# Patient Record
Sex: Male | Born: 1950 | Race: White | Hispanic: No | Marital: Married | Smoking: Never smoker
Health system: Southern US, Academic
[De-identification: ages and names within clinical notes are randomized; demographics above are authoritative.]

## PROBLEM LIST (undated history)

## (undated) DIAGNOSIS — M1990 Unspecified osteoarthritis, unspecified site: Secondary | ICD-10-CM

## (undated) DIAGNOSIS — M539 Dorsopathy, unspecified: Secondary | ICD-10-CM

## (undated) DIAGNOSIS — F32A Depression, unspecified: Secondary | ICD-10-CM

## (undated) DIAGNOSIS — R6889 Other general symptoms and signs: Secondary | ICD-10-CM

## (undated) DIAGNOSIS — G4730 Sleep apnea, unspecified: Secondary | ICD-10-CM

## (undated) DIAGNOSIS — Z9989 Dependence on other enabling machines and devices: Secondary | ICD-10-CM

## (undated) DIAGNOSIS — N429 Disorder of prostate, unspecified: Secondary | ICD-10-CM

## (undated) HISTORY — PX: HX BACK SURGERY: SHX140

## (undated) HISTORY — PX: HX HERNIA REPAIR: SHX51

---

## 2004-01-18 ENCOUNTER — Other Ambulatory Visit (HOSPITAL_COMMUNITY): Payer: Self-pay

## 2018-08-04 DIAGNOSIS — F4542 Pain disorder with related psychological factors: Secondary | ICD-10-CM | POA: Insufficient documentation

## 2018-08-04 DIAGNOSIS — F259 Schizoaffective disorder, unspecified: Secondary | ICD-10-CM | POA: Insufficient documentation

## 2018-08-04 DIAGNOSIS — F331 Major depressive disorder, recurrent, moderate: Secondary | ICD-10-CM | POA: Insufficient documentation

## 2021-01-22 IMAGING — MR MRI LUMBAR SPINE WITHOUT AND WITH CONTRAST
1 series · 2 of 2 positions shown · IV contrast (gadavist)
Comparison: Radiographs of the lumbar spine obtained earlier the same day.

﻿EXAM:  30106   MRI LUMBAR SPINE WITHOUT AND WITH CONTRAST
INDICATION: Lower back pain with right foot numbness.  History of remote back surgery.
TECHNIQUE: Multiplanar multisequential MRI of the lumbar spine was performed without and with 6 ml of Gadavist.

[Series 16: T2 · axial · 4.0mm · 0.47mm/px · z∈[-154,-149]mm · 2 of 2 slices shown]
[im 1/2]
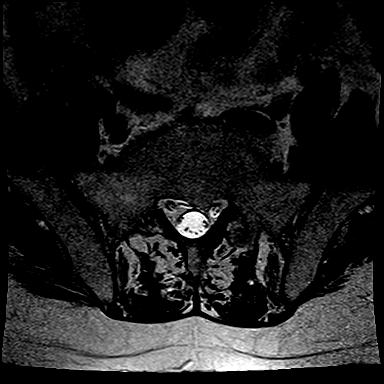
[im 2/2]
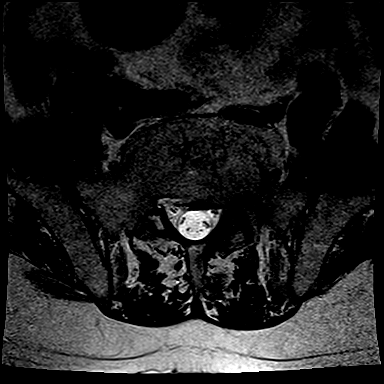

[2 of 2 positions shown; findings below may reference images not displayed]

FINDINGS: Vertebral bodies are normal in height, alignment and signal intensity.  There is no acute fracture or subluxation.  Distal spinal cord is normal in signal intensity and terminates normally at L1-2 disc space level.  Spinal canal is congenitally narrow.  

At L1-2 level, there is a small broad-based central disc bulge, mildly effacing the ventral thecal sac.  There is moderate right and mild left neural foraminal stenosis from bulging annulus.  

L2-3 level is unremarkable.  

At L3-4 level, there is a small broad-based central disc bulge resulting in mild to moderate spinal stenosis.  There is moderate bilateral neural foraminal stenosis from facet arthropathy and bulging annulus.  

At L4-5 level, there is a small broad-based central disc bulge resulting in severe spinal stenosis.  There is moderate to severe bilateral neural foraminal stenosis from facet arthropathy and bulging annulus.  

There appears to be surgical fusion at L5-S1 level.  There is no significant disc herniation.  There is moderate left neural foraminal stenosis from facet arthropathy.  

Following intravenous contrast administration, there is no abnormal nerve root or paraspinal soft tissue enhancement.
IMPRESSION: 1. Suggestion of surgical fusion of L5 and S1 vertebral bodies.  

2. Severe spinal stenosis at L4-5 level from a small central disc bulge.  

3. Mild to moderate spinal stenosis at L3-4 level from a small central disc bulge.  

4. Multi-level neural foraminal stenosis as detailed above.

## 2022-01-15 ENCOUNTER — Other Ambulatory Visit: Payer: Medicare Other | Attending: Family | Admitting: Family

## 2022-01-15 ENCOUNTER — Ambulatory Visit (INDEPENDENT_AMBULATORY_CARE_PROVIDER_SITE_OTHER): Payer: Medicare Other

## 2022-01-15 ENCOUNTER — Encounter (INDEPENDENT_AMBULATORY_CARE_PROVIDER_SITE_OTHER): Payer: Self-pay | Admitting: Family

## 2022-01-15 ENCOUNTER — Other Ambulatory Visit: Payer: Self-pay

## 2022-01-15 ENCOUNTER — Ambulatory Visit (INDEPENDENT_AMBULATORY_CARE_PROVIDER_SITE_OTHER): Payer: Medicare Other | Admitting: Family

## 2022-01-15 VITALS — BP 127/68 | HR 84 | Resp 16 | Ht 70.0 in | Wt 201.8 lb

## 2022-01-15 DIAGNOSIS — R972 Elevated prostate specific antigen [PSA]: Secondary | ICD-10-CM

## 2022-01-15 DIAGNOSIS — I10 Essential (primary) hypertension: Secondary | ICD-10-CM

## 2022-01-15 DIAGNOSIS — F319 Bipolar disorder, unspecified: Secondary | ICD-10-CM

## 2022-01-15 DIAGNOSIS — K259 Gastric ulcer, unspecified as acute or chronic, without hemorrhage or perforation: Secondary | ICD-10-CM

## 2022-01-15 DIAGNOSIS — E785 Hyperlipidemia, unspecified: Secondary | ICD-10-CM

## 2022-01-15 DIAGNOSIS — N400 Enlarged prostate without lower urinary tract symptoms: Secondary | ICD-10-CM

## 2022-01-15 DIAGNOSIS — F209 Schizophrenia, unspecified: Secondary | ICD-10-CM

## 2022-01-15 DIAGNOSIS — R350 Frequency of micturition: Secondary | ICD-10-CM

## 2022-01-15 HISTORY — DX: Hyperlipidemia, unspecified: E78.5

## 2022-01-15 HISTORY — DX: Essential (primary) hypertension: I10

## 2022-01-15 HISTORY — DX: Elevated prostate specific antigen (PSA): R97.20

## 2022-01-15 HISTORY — DX: Frequency of micturition: R35.0

## 2022-01-15 HISTORY — DX: Schizophrenia, unspecified (CMS HCC): F20.9

## 2022-01-15 HISTORY — DX: Bipolar disorder, unspecified (CMS HCC): F31.9

## 2022-01-15 HISTORY — DX: Benign prostatic hyperplasia without lower urinary tract symptoms: N40.0

## 2022-01-15 HISTORY — DX: Gastric ulcer, unspecified as acute or chronic, without hemorrhage or perforation: K25.9

## 2022-01-15 LAB — PSA, DIAGNOSTIC: PSA: 5.67 ng/mL — ABNORMAL HIGH (ref <=4.00)

## 2022-01-15 NOTE — Progress Notes (Addendum)
UROLOGY, [facility]  [address] [place] [address]      Return Patient Note     Name: Michael Norton MRN:  [id number]   Date: 01/15/2022 Age/DOB:71 y.o. 1951/10/03        Name: Michael Norton                       Date of Birth: 1951-03-11   MRN:  [id number]                         Date of visit: 01/15/2022     PCP: [facility]   Referring Provider: No referring provider defined for this encounter.     HPI:  Michael Norton is a 71 y.o. male who presents with Elevated PSA (6 MONTH FOLLOW UP)   to clinic.      PSA  01/02/21: 5.57   PSA 05/25/21: 5.34   PSA 12/26/21: 5.67   PSA 03/18/22: 6.55     There is a family history of prostate cancer in brothers.    mpMRI in 2020 and 2021 were both PI-RADS 2. On most recent, the prostate measured 4.5 x 3.4 x 3.4 cm with estimated volume of 28 cc.     Past Medical History  Current Outpatient Medications   Medication Sig   . celecoxib (CELEBREX) 200 mg Oral Capsule Take 1 Capsule (200 mg total) by mouth Twice daily   . gabapentin (NEURONTIN) 400 mg Oral Capsule Take 1 Capsule (400 mg total) by mouth   . levomilnacipran (FETZIMA) 20 mg Oral Capsule,Sustained Action 24 hr Take 1 Capsule (20 mg total) by mouth Once a day   . metoprolol succinate (TOPROL-XL) 25 mg Oral Tablet Sustained Release 24 hr Take 1 Tablet (25 mg total) by mouth Once a day   . multivit-minerals/folic acid (ADULT ONE DAILY MULTIVITAMIN ORAL) Take by mouth   . omeprazole (PRILOSEC) 40 mg Oral Capsule, Delayed Release(E.C.) Take 1 Capsule (40 mg total) by mouth Once a day   . pantoprazole (PROTONIX) 40 mg Oral Tablet, Delayed Release (E.C.) Take 1 Tablet (40 mg total) by mouth Once a day   . rosuvastatin (CRESTOR) 5 mg Oral Tablet Take 1 Tablet (5 mg total) by mouth Every evening   . tamsulosin (FLOMAX) 0.4 mg Oral Capsule Take 1 Capsule (0.4 mg total) by mouth Every evening after dinner   . ziprasidone (GEODON) 20 mg Oral Capsule Take 1 Capsule (20 mg total) by mouth Once a day   .  ziprasidone (GEODON) 60 mg Oral Capsule Take 1 Capsule (60 mg total) by mouth Twice daily with food   . zolpidem (AMBIEN) 10 mg Oral Tablet Take 1 Tablet (10 mg total) by mouth Every night as needed for Insomnia     Allergies   Allergen Reactions   . Codeine Nausea/ Vomiting     Past Medical History:   Diagnosis Date   . Bipolar disorder (CMS HCC) 01/15/2022   . BPH (benign prostatic hyperplasia) 01/15/2022   . Elevated PSA 01/15/2022    06/2020 mpMRI PI-RADS 2   . Gastric ulcer 01/15/2022   . HLD (hyperlipidemia) 01/15/2022   . HTN (hypertension) 01/15/2022   . Schizophrenia (CMS HCC) 01/15/2022   . Urinary frequency 01/15/2022         Past Surgical History:   Procedure Laterality Date   . HX BACK SURGERY     .  HX HERNIA REPAIR           Family Medical History:     Problem Relation (Age of Onset)    COPD Sister, Brother    Dementia Mother    Heart Disease Father, Brother    Liver Disease Brother    Prostate Cancer Other          Social History     Socioeconomic History   . Marital status: Married   Tobacco Use   . Smoking status: Never   . Smokeless tobacco: Never   Substance and Sexual Activity   . Alcohol use: Never   . Drug use: Never     Social Determinants of Health     Financial Resource Strain: Low Risk    . SDOH Financial: No   Transportation Needs: Low Risk    . SDOH Transportation: No   Social Connections: Unknown   . SDOH Social Isolation: Patient chooses not to answer   Intimate Partner Violence: Low Risk    . SDOH Domestic Violence: No   Housing Stability: Low Risk    . SDOH Housing Situation: I have housing.   Marland Kitchen SDOH Housing Worry: No        Patient Active Problem List    Diagnosis Date Noted   . Urinary frequency 01/15/2022   . HLD (hyperlipidemia) 01/15/2022   . Schizophrenia (CMS HCC) 01/15/2022   . Bipolar disorder (CMS HCC) 01/15/2022   . HTN (hypertension) 01/15/2022   . BPH (benign prostatic hyperplasia) 01/15/2022   . Elevated PSA 01/15/2022     06/2020 mpMRI PI-RADS 2     . Gastric ulcer 01/15/2022         REVIEW OF SYSTEMS:   As per HPI.      Physical Exam  Constitutional:       Appearance: Normal appearance.   Pulmonary:      Effort: Pulmonary effort is normal. No respiratory distress.   Neurological:      Mental Status: He is alert.   Psychiatric:         Mood and Affect: Mood normal.        BP 127/68   Pulse 84   Resp 16   Ht 1.778 m (5\' 10" )   Wt 91.5 kg (201 lb 12.8 oz)   BMI 28.96 kg/m         Assessment/Plan  Assessment/Plan   1. Elevated PSA      Check prostate specific antigen today. Assuming prostate specific antigen is stable, he will follow-up in 6 months.    Addendum 03/28/22: 03/18/22 PSA was 6.55. Reviewed with Dr. 03/20/22 today and he agrees with Bx. He will need Levaquin 500 mg po daily x 3 days (day before, day of, day after).    Rome Schlauch, FNP-C

## 2022-01-16 ENCOUNTER — Other Ambulatory Visit (INDEPENDENT_AMBULATORY_CARE_PROVIDER_SITE_OTHER): Payer: Self-pay | Admitting: Family

## 2022-01-16 DIAGNOSIS — R972 Elevated prostate specific antigen [PSA]: Secondary | ICD-10-CM

## 2022-01-31 ENCOUNTER — Other Ambulatory Visit: Payer: Self-pay

## 2022-01-31 ENCOUNTER — Other Ambulatory Visit: Payer: Medicare Other

## 2022-01-31 DIAGNOSIS — Z79899 Other long term (current) drug therapy: Secondary | ICD-10-CM

## 2022-01-31 DIAGNOSIS — Z125 Encounter for screening for malignant neoplasm of prostate: Secondary | ICD-10-CM | POA: Insufficient documentation

## 2022-01-31 DIAGNOSIS — R972 Elevated prostate specific antigen [PSA]: Secondary | ICD-10-CM

## 2022-01-31 DIAGNOSIS — G894 Chronic pain syndrome: Secondary | ICD-10-CM | POA: Insufficient documentation

## 2022-01-31 LAB — CBC WITH DIFF
BASOPHIL #: 0 10*3/uL (ref 0.00–2.50)
BASOPHIL %: 1 % (ref 0–3)
EOSINOPHIL #: 0.1 10*3/uL (ref 0.00–2.40)
EOSINOPHIL %: 1 % (ref 0–7)
HCT: 37.7 % — ABNORMAL LOW (ref 42.0–51.0)
HGB: 13.9 g/dL (ref 13.5–18.0)
LYMPHOCYTE #: 2 10*3/uL — ABNORMAL LOW (ref 2.10–11.00)
LYMPHOCYTE %: 30 % (ref 25–45)
MCH: 31.9 pg (ref 27.0–32.0)
MCHC: 36.8 g/dL — ABNORMAL HIGH (ref 32.0–36.0)
MCV: 86.6 fL (ref 78.0–99.0)
MONOCYTE #: 0.5 10*3/uL (ref 0.00–4.10)
MONOCYTE %: 7 % (ref 0–12)
MPV: 5.7 fL — ABNORMAL LOW (ref 7.4–10.4)
NEUTROPHIL #: 4.1 10*3/uL (ref 4.10–29.00)
NEUTROPHIL %: 61 % (ref 40–76)
PLATELETS: 257 10*3/uL (ref 140–440)
RBC: 4.35 10*6/uL (ref 4.20–6.00)
RDW: 12.5 % (ref 11.6–14.8)
WBC: 6.7 10*3/uL (ref 4.0–10.5)
WBCS UNCORRECTED: 6.7 10*3/uL

## 2022-01-31 LAB — COMPREHENSIVE METABOLIC PANEL, NON-FASTING
ALBUMIN/GLOBULIN RATIO: 1.6 — ABNORMAL HIGH (ref 0.8–1.4)
ALBUMIN: 4.1 g/dL (ref 3.5–5.7)
ALKALINE PHOSPHATASE: 57 U/L (ref 34–104)
ALT (SGPT): 29 U/L (ref 7–52)
ANION GAP: 6 mmol/L — ABNORMAL LOW (ref 10–20)
AST (SGOT): 27 U/L (ref 13–39)
BILIRUBIN TOTAL: 0.6 mg/dL (ref 0.3–1.2)
BUN/CREA RATIO: 16 (ref 6–22)
BUN: 16 mg/dL (ref 7–25)
CALCIUM, CORRECTED: 8.9 mg/dL (ref 8.9–10.8)
CALCIUM: 9 mg/dL (ref 8.6–10.3)
CHLORIDE: 104 mmol/L (ref 98–107)
CO2 TOTAL: 29 mmol/L (ref 21–31)
CREATININE: 1.01 mg/dL (ref 0.60–1.30)
ESTIMATED GFR: 80 mL/min/{1.73_m2} (ref >59)
GLOBULIN: 2.5 — ABNORMAL LOW (ref 2.9–5.4)
GLUCOSE: 118 mg/dL — ABNORMAL HIGH (ref 74–109)
OSMOLALITY, CALCULATED: 280 mOsm/kg (ref 270–290)
POTASSIUM: 4.4 mmol/L (ref 3.5–5.1)
PROTEIN TOTAL: 6.6 g/dL (ref 6.4–8.9)
SODIUM: 139 mmol/L (ref 136–145)

## 2022-03-18 ENCOUNTER — Other Ambulatory Visit: Payer: Medicare Other | Attending: Family | Admitting: Family

## 2022-03-18 ENCOUNTER — Other Ambulatory Visit: Payer: Self-pay

## 2022-03-18 ENCOUNTER — Ambulatory Visit (INDEPENDENT_AMBULATORY_CARE_PROVIDER_SITE_OTHER): Payer: Medicare Other

## 2022-03-18 DIAGNOSIS — R972 Elevated prostate specific antigen [PSA]: Secondary | ICD-10-CM

## 2022-03-18 LAB — PSA, DIAGNOSTIC: PSA: 6.55 ng/mL — ABNORMAL HIGH (ref <=4.00)

## 2022-03-19 ENCOUNTER — Encounter (INDEPENDENT_AMBULATORY_CARE_PROVIDER_SITE_OTHER): Payer: Medicare Other | Admitting: Family

## 2022-03-28 ENCOUNTER — Other Ambulatory Visit (INDEPENDENT_AMBULATORY_CARE_PROVIDER_SITE_OTHER): Payer: Self-pay | Admitting: Student in an Organized Health Care Education/Training Program

## 2022-03-28 DIAGNOSIS — Z01818 Encounter for other preprocedural examination: Secondary | ICD-10-CM

## 2022-04-05 ENCOUNTER — Other Ambulatory Visit (INDEPENDENT_AMBULATORY_CARE_PROVIDER_SITE_OTHER): Payer: Self-pay | Admitting: Student in an Organized Health Care Education/Training Program

## 2022-04-17 ENCOUNTER — Other Ambulatory Visit: Payer: Medicare Other | Attending: Student in an Organized Health Care Education/Training Program

## 2022-04-17 ENCOUNTER — Other Ambulatory Visit (HOSPITAL_COMMUNITY): Payer: Self-pay | Admitting: Student in an Organized Health Care Education/Training Program

## 2022-04-17 ENCOUNTER — Other Ambulatory Visit: Payer: Self-pay

## 2022-04-17 ENCOUNTER — Other Ambulatory Visit (INDEPENDENT_AMBULATORY_CARE_PROVIDER_SITE_OTHER): Payer: Self-pay | Admitting: Student in an Organized Health Care Education/Training Program

## 2022-04-17 ENCOUNTER — Inpatient Hospital Stay (HOSPITAL_COMMUNITY)
Admission: RE | Admit: 2022-04-17 | Discharge: 2022-04-17 | Disposition: A | Discharge status: Home / Self Care | Payer: Medicare Other | Source: Ambulatory Visit | Attending: Student in an Organized Health Care Education/Training Program | Admitting: Student in an Organized Health Care Education/Training Program

## 2022-04-17 DIAGNOSIS — Z01811 Encounter for preprocedural respiratory examination: Secondary | ICD-10-CM | POA: Insufficient documentation

## 2022-04-17 DIAGNOSIS — Z01818 Encounter for other preprocedural examination: Secondary | ICD-10-CM | POA: Insufficient documentation

## 2022-04-17 LAB — URINALYSIS, MACROSCOPIC
BILIRUBIN: NEGATIVE mg/dL
BLOOD: NEGATIVE mg/dL
GLUCOSE: NEGATIVE mg/dL
KETONES: NEGATIVE mg/dL
LEUKOCYTES: NEGATIVE WBCs/uL
NITRITE: NEGATIVE
PH: 7 (ref 5.0–9.0)
PROTEIN: NEGATIVE mg/dL
SPECIFIC GRAVITY: 1.013 (ref 1.002–1.030)
UROBILINOGEN: NORMAL mg/dL

## 2022-04-17 LAB — CBC
HCT: 42 % (ref 42.0–51.0)
HGB: 14.9 g/dL (ref 13.5–18.0)
MCH: 31 pg (ref 27.0–32.0)
MCHC: 35.5 g/dL (ref 32.0–36.0)
MCV: 87.4 fL (ref 78.0–99.0)
MPV: 6 fL — ABNORMAL LOW (ref 7.4–10.4)
PLATELETS: 244 10*3/uL (ref 140–440)
RBC: 4.81 10*6/uL (ref 4.20–6.00)
RDW: 13.1 % (ref 11.6–14.8)
WBC: 6.4 10*3/uL (ref 4.0–10.5)
WBCS UNCORRECTED: 6.4 10*3/uL

## 2022-04-17 LAB — BASIC METABOLIC PANEL
ANION GAP: 5 mmol/L — ABNORMAL LOW (ref 10–20)
BUN/CREA RATIO: 14 (ref 6–22)
BUN: 12 mg/dL (ref 7–25)
CALCIUM: 9.7 mg/dL (ref 8.6–10.3)
CHLORIDE: 106 mmol/L (ref 98–107)
CO2 TOTAL: 29 mmol/L (ref 21–31)
CREATININE: 0.86 mg/dL (ref 0.60–1.30)
ESTIMATED GFR: 92 mL/min/{1.73_m2} (ref >59)
GLUCOSE: 111 mg/dL — ABNORMAL HIGH (ref 74–109)
OSMOLALITY, CALCULATED: 280 mOsm/kg (ref 270–290)
POTASSIUM: 4.6 mmol/L (ref 3.5–5.1)
SODIUM: 140 mmol/L (ref 136–145)

## 2022-04-17 LAB — URINALYSIS, MICROSCOPIC
RBCS: <1 /hpf (ref <4)
SQUAMOUS EPITHELIAL: <1 /hpf (ref <28)
WBCS: <1 /hpf (ref <6)

## 2022-04-18 LAB — ECG 12 LEAD
Atrial Rate: 84 {beats}/min
Calculated P Axis: 79 degrees
Calculated R Axis: -12 degrees
Calculated T Axis: 71 degrees
PR Interval: 168 ms
QRS Duration: 92 ms
QT Interval: 366 ms
QTC Calculation: 432 ms
Ventricular rate: 84 {beats}/min

## 2022-04-23 ENCOUNTER — Other Ambulatory Visit (INDEPENDENT_AMBULATORY_CARE_PROVIDER_SITE_OTHER): Payer: Self-pay | Admitting: Student in an Organized Health Care Education/Training Program

## 2022-04-23 MED ORDER — LEVOFLOXACIN 500 MG TABLET
500.0000 mg | ORAL_TABLET | Freq: Every day | ORAL | 0 refills | Status: DC
Start: 2022-04-23 — End: 2022-05-09

## 2022-05-01 ENCOUNTER — Inpatient Hospital Stay
Admission: RE | Admit: 2022-05-01 | Discharge: 2022-05-01 | Disposition: A | Discharge status: Home / Self Care | Payer: Medicare Other | Source: Ambulatory Visit | Attending: Student in an Organized Health Care Education/Training Program | Admitting: Student in an Organized Health Care Education/Training Program

## 2022-05-01 ENCOUNTER — Other Ambulatory Visit: Payer: Self-pay

## 2022-05-01 ENCOUNTER — Ambulatory Visit (HOSPITAL_COMMUNITY): Payer: Medicare Other | Admitting: Anesthesiology

## 2022-05-01 ENCOUNTER — Encounter (HOSPITAL_COMMUNITY)
Admission: RE | Disposition: A | Discharge status: Home / Self Care | Payer: Self-pay | Source: Ambulatory Visit | Attending: Student in an Organized Health Care Education/Training Program

## 2022-05-01 ENCOUNTER — Encounter (HOSPITAL_COMMUNITY): Payer: Self-pay | Admitting: Student in an Organized Health Care Education/Training Program

## 2022-05-01 DIAGNOSIS — Z8042 Family history of malignant neoplasm of prostate: Secondary | ICD-10-CM | POA: Insufficient documentation

## 2022-05-01 DIAGNOSIS — D075 Carcinoma in situ of prostate: Secondary | ICD-10-CM | POA: Insufficient documentation

## 2022-05-01 DIAGNOSIS — N400 Enlarged prostate without lower urinary tract symptoms: Secondary | ICD-10-CM | POA: Insufficient documentation

## 2022-05-01 HISTORY — PX: PROSTATE BIOPSY: SHX241

## 2022-05-01 SURGERY — BIOPSY PROSTATE
Anesthesia: Monitor Anesthesia Care | Site: Prostate | Wound class: Clean Contaminated Wounds-The respiratory, GI, Genital, or urinary

## 2022-05-01 MED ORDER — HYDROCODONE 5 MG-ACETAMINOPHEN 325 MG TABLET
1.0000 | ORAL_TABLET | ORAL | Status: DC | PRN
Start: 2022-05-01 — End: 2022-05-01

## 2022-05-01 MED ORDER — FENTANYL (PF) 50 MCG/ML INJECTION WRAPPER
50.0000 ug | INJECTION | INTRAMUSCULAR | Status: DC | PRN
Start: 2022-05-01 — End: 2022-05-01

## 2022-05-01 MED ORDER — FAMOTIDINE (PF) 20 MG/2 ML INTRAVENOUS SOLUTION
INTRAVENOUS | Status: AC
Start: 2022-05-01 — End: 2022-05-01
  Filled 2022-05-01: qty 2

## 2022-05-01 MED ORDER — FENTANYL (PF) 50 MCG/ML INJECTION WRAPPER
12.5000 ug | INJECTION | INTRAMUSCULAR | Status: DC | PRN
Start: 2022-05-01 — End: 2022-05-01

## 2022-05-01 MED ORDER — LIDOCAINE 2 % MUCOSAL JELLY IN APPLICATOR
Status: AC
Start: 2022-05-01 — End: 2022-05-01
  Filled 2022-05-01: qty 10

## 2022-05-01 MED ORDER — LACTATED RINGERS INTRAVENOUS SOLUTION
INTRAVENOUS | Status: DC
Start: 2022-05-01 — End: 2022-05-01

## 2022-05-01 MED ORDER — HYDROMORPHONE 2 MG/ML INJECTION WRAPPER
0.2500 mg | INJECTION | INTRAMUSCULAR | Status: DC | PRN
Start: 2022-05-01 — End: 2022-05-01

## 2022-05-01 MED ORDER — MIDAZOLAM 5 MG/ML INJECTION WRAPPER
INTRAMUSCULAR | Status: AC
Start: 2022-05-01 — End: 2022-05-01
  Filled 2022-05-01: qty 1

## 2022-05-01 MED ORDER — GENTAMICIN IV - PHARMACIST TO DOSE PER PROTOCOL
Freq: Every day | Status: DC | PRN
Start: 2022-05-01 — End: 2022-05-01

## 2022-05-01 MED ORDER — ONDANSETRON HCL (PF) 4 MG/2 ML INJECTION SOLUTION
4.0000 mg | Freq: Once | INTRAMUSCULAR | Status: AC
Start: 2022-05-01 — End: 2022-05-01
  Administered 2022-05-01: 4 mg via INTRAVENOUS

## 2022-05-01 MED ORDER — SODIUM CHLORIDE 0.9 % (FLUSH) INJECTION SYRINGE
3.0000 mL | INJECTION | INTRAMUSCULAR | Status: DC | PRN
Start: 2022-05-01 — End: 2022-05-01

## 2022-05-01 MED ORDER — LIDOCAINE HCL 10 MG/ML (1 %) INJECTION SOLUTION
Freq: Once | INTRAMUSCULAR | Status: DC | PRN
Start: 2022-05-01 — End: 2022-05-01
  Administered 2022-05-01: 2 mL via INTRAMUSCULAR

## 2022-05-01 MED ORDER — ALBUTEROL SULFATE 2.5 MG/3 ML (0.083 %) SOLUTION FOR NEBULIZATION
2.5000 mg | INHALATION_SOLUTION | Freq: Once | RESPIRATORY_TRACT | Status: DC | PRN
Start: 2022-05-01 — End: 2022-05-01

## 2022-05-01 MED ORDER — HYDROMORPHONE 2 MG/ML INJECTION WRAPPER
0.5000 mg | INJECTION | INTRAMUSCULAR | Status: DC | PRN
Start: 2022-05-01 — End: 2022-05-01

## 2022-05-01 MED ORDER — PROPOFOL 10 MG/ML IV BOLUS
INJECTION | Freq: Once | INTRAVENOUS | Status: DC | PRN
Start: 2022-05-01 — End: 2022-05-01
  Administered 2022-05-01: 30 mg via INTRAVENOUS
  Administered 2022-05-01: 20 mg via INTRAVENOUS

## 2022-05-01 MED ORDER — MORPHINE 4 MG/ML INJECTION WRAPPER
1.0000 mg | INJECTION | INTRAMUSCULAR | Status: DC | PRN
Start: 2022-05-01 — End: 2022-05-01

## 2022-05-01 MED ORDER — ONDANSETRON HCL (PF) 4 MG/2 ML INJECTION SOLUTION
INTRAMUSCULAR | Status: AC
Start: 2022-05-01 — End: 2022-05-01
  Filled 2022-05-01: qty 2

## 2022-05-01 MED ORDER — FENTANYL (PF) 50 MCG/ML INJECTION SOLUTION
INTRAMUSCULAR | Status: AC
Start: 2022-05-01 — End: 2022-05-01
  Filled 2022-05-01: qty 2

## 2022-05-01 MED ORDER — SODIUM CHLORIDE 0.9 % (FLUSH) INJECTION SYRINGE
3.0000 mL | INJECTION | Freq: Three times a day (TID) | INTRAMUSCULAR | Status: DC
Start: 2022-05-01 — End: 2022-05-01

## 2022-05-01 MED ORDER — ONDANSETRON HCL (PF) 4 MG/2 ML INJECTION SOLUTION
4.0000 mg | Freq: Three times a day (TID) | INTRAMUSCULAR | Status: DC | PRN
Start: 2022-05-01 — End: 2022-05-01

## 2022-05-01 MED ORDER — FENTANYL (PF) 50 MCG/ML INJECTION WRAPPER
25.0000 ug | INJECTION | INTRAMUSCULAR | Status: DC | PRN
Start: 2022-05-01 — End: 2022-05-01

## 2022-05-01 MED ORDER — EPHEDRINE SULFATE 50 MG/ML INTRAVENOUS SOLUTION
Freq: Once | INTRAVENOUS | Status: DC | PRN
Start: 2022-05-01 — End: 2022-05-01
  Administered 2022-05-01: 10 mg via INTRAVENOUS

## 2022-05-01 MED ORDER — SODIUM CHLORIDE 0.9 % INTRAVENOUS SOLUTION
5.0000 mg/kg | Freq: Once | INTRAVENOUS | Status: AC
Start: 2022-05-01 — End: 2022-05-01
  Administered 2022-05-01: 400 mg via INTRAVENOUS
  Filled 2022-05-01: qty 10

## 2022-05-01 MED ORDER — IPRATROPIUM 0.5 MG-ALBUTEROL 3 MG (2.5 MG BASE)/3 ML NEBULIZATION SOLN
3.0000 mL | INHALATION_SOLUTION | Freq: Once | RESPIRATORY_TRACT | Status: DC | PRN
Start: 2022-05-01 — End: 2022-05-01

## 2022-05-01 MED ORDER — DEXAMETHASONE SODIUM PHOSPHATE 4 MG/ML INJECTION SOLUTION
4.0000 mg | Freq: Once | INTRAMUSCULAR | Status: AC
Start: 2022-05-01 — End: 2022-05-01
  Administered 2022-05-01: 4 mg via INTRAVENOUS

## 2022-05-01 MED ORDER — ONDANSETRON HCL (PF) 4 MG/2 ML INJECTION SOLUTION
4.0000 mg | Freq: Once | INTRAMUSCULAR | Status: DC | PRN
Start: 2022-05-01 — End: 2022-05-01

## 2022-05-01 MED ORDER — FAMOTIDINE (PF) 20 MG/2 ML INTRAVENOUS SOLUTION
20.0000 mg | Freq: Once | INTRAVENOUS | Status: AC
Start: 2022-05-01 — End: 2022-05-01
  Administered 2022-05-01: 20 mg via INTRAVENOUS

## 2022-05-01 MED ORDER — DEXAMETHASONE SODIUM PHOSPHATE 4 MG/ML INJECTION SOLUTION
INTRAMUSCULAR | Status: AC
Start: 2022-05-01 — End: 2022-05-01
  Filled 2022-05-01: qty 1

## 2022-05-01 MED ORDER — MIDAZOLAM 5 MG/ML INJECTION WRAPPER
2.5000 mg | Freq: Once | INTRAMUSCULAR | Status: DC | PRN
Start: 2022-05-01 — End: 2022-05-01
  Administered 2022-05-01: 2.5 mg via INTRAVENOUS

## 2022-05-01 MED ORDER — LIDOCAINE HCL 10 MG/ML (1 %) INJECTION SOLUTION
INTRAMUSCULAR | Status: AC
Start: 2022-05-01 — End: 2022-05-01
  Filled 2022-05-01: qty 20

## 2022-05-01 MED ORDER — HALOPERIDOL LACTATE 5 MG/ML INJECTION SOLUTION
1.0000 mg | Freq: Once | INTRAMUSCULAR | Status: DC | PRN
Start: 2022-05-01 — End: 2022-05-01

## 2022-05-01 MED ORDER — FENTANYL (PF) 50 MCG/ML INJECTION WRAPPER
INJECTION | Freq: Once | INTRAMUSCULAR | Status: DC | PRN
Start: 2022-05-01 — End: 2022-05-01
  Administered 2022-05-01: 50 ug via INTRAVENOUS

## 2022-05-01 SURGICAL SUPPLY — 44 items
BAG BIOHAZ RD 30X24IN THK3 MIL C8-10GL LLDPE INFCT WASTE CAN (MED SURG SUPPLIES) ×2
BAG BIOHAZ RD 30X24IN THK3 MIL C8-10GL LLDPE INFCT WASTE CAN PNCT RST (MED SURG SUPPLIES) ×1
BAG BIOHAZ RD 43X30IN THK3 MIL C20-30GL LLDPE INFCT WASTE (MED SURG SUPPLIES) ×2
BAG BIOHAZ RD 43X30IN THK3 MIL C20-30GL LLDPE INFCT WASTE CAN PNCT RST (MED SURG SUPPLIES) ×1 IMPLANT
CONTAINR HISTO C90ML 10% NEUT BF FRMLN POLYPROP PREFL 60ML (MISCELLANEOUS PT CARE ITEMS) ×24 IMPLANT
CONV USE 31829 - NEEDLE HYPO  18GA 1.5IN STD REG BVL LF (MED SURG SUPPLIES) ×1 IMPLANT
CONV USE ITEM 321852 - GLOVE SURG 6.5 LF  PLISPRN (GLOVES AND ACCESSORIES) ×1 IMPLANT
CONV USE ITEM 81225 - BAG BIOHAZ RD 30X24IN THK3 MIL C8-10GL LLDPE INFCT WASTE CAN PNCT RST (MED SURG SUPPLIES) ×1 IMPLANT
COVER PROBE 8X1IN NONST LF  ECVT (MED SURG SUPPLIES) ×1
COVER PROBE 8X1IN NONST LF ECVT (MED SURG SUPPLIES) ×2
DISCONTINUED USE ITEM 340762 - COVER PROBE 8X1IN NONST LF  ECVT (MED SURG SUPPLIES) ×1 IMPLANT
DRESSING NON-ADHERENT 3X8_STRL (WOUND CARE/ENTEROSTOMAL SUPPLY) ×4
GLOVE SURG 6.5 LF PF SMOOTH STRL WHT PLISPRN (GLOVES AND ACCESSORIES) ×2
GLOVE SURG 7 LF  PF STRL PLISPRN DISP (GLOVES AND ACCESSORIES) ×1 IMPLANT
GLOVE SURG 7 LF PF SMOOTH STRL WHT PLISPRN (GLOVES AND ACCESSORIES) ×2
GLOVE SURG 7.5 LF PF SMOOTH STRL WHT PLISPRN (GLOVES AND ACCESSORIES) ×2
GLOVE SURG LF  PF STRL 7.5 PLISPRN DISP (GLOVES AND ACCESSORIES) ×1 IMPLANT
GOWN SURG 2XL STD LGTH REG L3 NONREINFORCE BRTHBL TWL STRL (DRAPE/PACKS/SHEETS/OR TOWEL) ×2
GOWN SURG 2XL STD LGTH REG L3 NONREINFORCE BRTHBL TWL STRL LF  DISP BLU HALYARD SPECTRUM SMS (DRAPE/PACKS/SHEETS/OR TOWEL) ×1 IMPLANT
GOWN SURG XL STD LGTH L3 NONREINFORCE HKLP CLSR TWL STRL LF (DRAPE/PACKS/SHEETS/OR TOWEL) ×2
GOWN SURG XL STD LGTH L3 NONREINFORCE HKLP CLSR TWL STRL LF  DISP BLU SPECTRUM SMS (DRAPE/PACKS/SHEETS/OR TOWEL) ×1
GOWN SURG XL STD LGTH L3 NONREINFORCE HKLP CLSR TWL STRL LF DISP BLU SPECTRUM SMS (DRAPE/PACKS/SHEETS/OR TOWEL) ×1 IMPLANT
GUIDE NEEDLE 1.6MM BIOPSY BPL STRL DISP LF  TRANSDUC 8818 8808E (MED SURG SUPPLIES) ×1 IMPLANT
GUIDE NEEDLE 1.6MM BIOPSY BPL_STRL DISP LF TRANSDUC 8818 (MED SURG SUPPLIES) ×2
HDPE THK22 UM C40-45 GL L48 IN X W40 IN NATURAL (MISCELLANEOUS PT CARE ITEMS) ×2 IMPLANT
INSTR BIOPSY PNK 20CM 18GA MXCOR 22MM ANG 2 TRGR UL SHRP TIP BVL 13.8CM STRL LF  DISP (MED SURG SUPPLIES) ×1 IMPLANT
INSTR BIOPSY PNK 20CM 18GA MXC_OR ANG 22MM 2 TRGR UL SHRP TIP (MED SURG SUPPLIES) ×2
INSTR BIOPSY PNK 25CM 18GA MXCOR 22MM ANG 2 TRGR UL SHRP TIP BVL 13.8CM STRL LF  DISP (MED SURG SUPPLIES) ×1 IMPLANT
INSTR BIOPSY PNK 25CM 18GA MXC_OR 22MM ANG 2 TRGR UL SHRP TIP (MED SURG SUPPLIES) ×2
LABEL MED CORRECT MED LABELING SYS 4 FLG 2 SHEET 24 PRPRNT (MED SURG SUPPLIES) ×2
LABEL MED CORRECT MED LABELING SYS 4 FLG 2 SHEET 24 PRPRNT STRL (MED SURG SUPPLIES) ×1 IMPLANT
LINER SUCT MEDIVAC CRD TW LOCK LID SHTOF VALVE CAN PORT 3L LF  DISP (MED SURG SUPPLIES) ×1 IMPLANT
LINER SUCT MEDIVAC CRD TW LOCK_LID SHTOF VALVE CAN PORT 3L (MED SURG SUPPLIES) ×2
NEEDLE HYPO 18GA 1.5IN STD RE_G BVL LF (MED SURG SUPPLIES) ×2
NEEDLE SPINAL BLK 7IN 22GA QUINCKE LONG LGTH REG WL POLYPROP STRL LF  DISP (MED SURG SUPPLIES) ×1 IMPLANT
NEEDLE SPINAL BLK 7IN 22GA QUI_NCKE LONG LGTH REG WL POLYPROP (MED SURG SUPPLIES) ×2
PAD DRESS 8X3IN MDCHC NONADH NWVN LF  STRL DISP WHT (WOUND CARE SUPPLY) ×2 IMPLANT
SOL IRRG 0.9% NACL 1000ML PLASTIC PR BTL ISTNC N-PYRG STRL LF (MEDICATIONS/SOLUTIONS) ×1 IMPLANT
SOLUTION IRRG NS 2F7124 1000CC_12/CS (MEDICATIONS/SOLUTIONS) ×2
SYRINGE LL 10ML LF  STRL GRAD N-PYRG DEHP-FR PVC FREE MED DISP (MED SURG SUPPLIES) ×1 IMPLANT
SYRINGE LL 10ML LF STRL MED D_ISP (MED SURG SUPPLIES) ×2
TOWEL 24X16IN COTTON BLU DISP SURG STRL LF (DRAPE/PACKS/SHEETS/OR TOWEL) ×2 IMPLANT
TUBE BUBBLE CONNECTING_8888280214 1EA/BX/CS (MED SURG SUPPLIES) ×2
TUBING SUCT CLR 100FT 3/16IN ARGYLE UNIV PVC NCDTV BBL NONST LF (MED SURG SUPPLIES) ×1 IMPLANT

## 2022-05-01 NOTE — OR Nursing (Signed)
MEASUREMENTS OBTAINED BY DR CUTRONE PRIOR START OF CASE AS FOLLOWS:  4.4; 3.1;2.9

## 2022-05-01 NOTE — OR Surgeon (Signed)
[facility]                                              OPERATIVE NOTE    Patient Name: Ludwin, Flahive Number: [id number]  Date of Service: 05/01/2022   Date of Birth: 1951-01-22    All elements must be documented.    Pre-Operative Diagnosis:elevated PSA   Post-Operative Diagnosis:same  Procedure(s)/Description:transrectal ultrasound guided prostate needle biopsy  Findings: 22cc prostate, no hypoechoic lesions    Patient prepped and draped in sterile fashion.  Well lubricated transrectal ultrasound probe was inserted into the rectum in the prostate images identified.  Measurements were taken out as listed above.  No grossly hypoechoic lesions were identified.  1% lidocaine was injected at the tip of the seminal vesicles bilaterally for a volume of 2 cc on each side.  A systematic fashion 6 biopsies were taken from each side of the prostate.  Specimen was sent for analysis.  Patient tolerated procedure well with no untoward event        Attending Surgeon: Arlana Hove  Assistant(s): NA    Anesthesia Type: General  Estimated Blood Loss:  Minimal  Blood Given: NA  Fluids Given: NS  Complications (unintended/unexpected/iatrogenic/accidental/inadvertent events):  NA  Characteristic Event (routinely expected or inherent to the difficulty/nature of the procedure): NA  Did the use of current and/or prior Anticoagulants impact the outcome of the case? No  Wound Class: Clean Contaminated Wounds -Respiratory, GI, Genital, or Urinary    Tubes: None  Drains: None  Specimens/ Cultures: 12 prostate biopsies  Implants: NA           Disposition: PACU - hemodynamically stable.  Condition: stable    Plan:  Follow up in one week       Arlana Hove, DO

## 2022-05-01 NOTE — Anesthesia Transfer of Care (Signed)
ANESTHESIA TRANSFER OF CARE   Michael Norton is a 71 y.o. ,male, Weight: 91.6 kg (202 lb)   had Procedure(s):  CYSTOSCOPY; TRANSRECTAL ULTRASOUND OF PROSTATE; ULTRASOUND GUIDED PROSTATE BIOPSY  performed  05/01/22   Primary Service: Arlana Hove, DO    Past Medical History:   Diagnosis Date   . Bipolar disorder (CMS HCC) 01/15/2022   . BPH (benign prostatic hyperplasia) 01/15/2022   . Elevated PSA 01/15/2022    06/2020 mpMRI PI-RADS 2   . Gastric ulcer 01/15/2022   . HLD (hyperlipidemia) 01/15/2022   . HTN (hypertension) 01/15/2022   . Schizophrenia (CMS HCC) 01/15/2022   . Urinary frequency 01/15/2022      Allergy History as of 05/01/22     CODEINE       Noted Status Severity Type Reaction    01/15/22 0630 [address], [place] 11/07/17 Active Low  Nausea/ Vomiting              I completed my transfer of care / handoff to the receiving personnel during which we discussed:  Access, Airway, All key/critical aspects of case discussed, Analgesia, Antibiotics, Expectation of post procedure, Fluids/Product, Gave opportunity for questions and acknowledgement of understanding, Labs and PMHx      Post Location: PACU                        Additional Info:Report to RN @ Bedside                                   Last OR Temp: Temperature: 36.4 C (97.6 F)  ABG:  POTASSIUM   Date Value Ref Range Status   04/17/2022 4.6 3.5 - 5.1 mmol/L Final     KETONES   Date Value Ref Range Status   04/17/2022 Negative Negative, Trace mg/dL Final     CALCIUM   Date Value Ref Range Status   04/17/2022 9.7 8.6 - 10.3 mg/dL Final     Calculated P Axis   Date Value Ref Range Status   04/17/2022 79 degrees Final     Calculated R Axis   Date Value Ref Range Status   04/17/2022 -12 degrees Final     Calculated T Axis   Date Value Ref Range Status   04/17/2022 71 degrees Final     Airway:* No LDAs found *  Blood pressure 109/68, pulse 65, temperature 36.4 C (97.6 F), resp. rate (!) 22, height 1.778 m (5\' 10" ), weight 91.6 kg (202 lb), SpO2 100 %.

## 2022-05-01 NOTE — H&P (Signed)
[facility]  Urology Admission History and Physical      Carmel, Waddington, 71 y.o. male  Encounter Start Date:  05/01/2022  Inpatient Admission Date:    Date of Birth:  [birth year]    PCP: [facility]    Information Obtained from: patient  Chief Complaint: elevated PSA       HPI:    Michael Norton is a 71 y.o., White male who presents with elevated PSA and family history of prostate cancer in brothers. He presents today for prostate biopsy. He denies fevers, chills, nausea, vomiting, hematuria, dysuria, flank pain, incontinence, dribbling, hesitancy, suprapubic pain, headaches, vision changes, shortness of breath, chest pain.         ROS:  MUST comment on all "Abnormal" findings   ROS Other than ROS in the HPI, all other systems were negative.      PAST MEDICAL/ FAMILY/ SOCIAL HISTORY:       Past Medical History:   Diagnosis Date   . Bipolar disorder (CMS HCC) 01/15/2022   . BPH (benign prostatic hyperplasia) 01/15/2022   . Elevated PSA 01/15/2022    06/2020 mpMRI PI-RADS 2   . Gastric ulcer 01/15/2022   . HLD (hyperlipidemia) 01/15/2022   . HTN (hypertension) 01/15/2022   . Schizophrenia (CMS HCC) 01/15/2022   . Urinary frequency 01/15/2022         Allergies   Allergen Reactions   . Codeine Nausea/ Vomiting     Medications Prior to Admission     Prescriptions    ascorbic acid, vitamin C, (VITAMIN C) 500 mg Oral Tablet    Take 1 Tablet (500 mg total) by mouth Once a day    celecoxib (CELEBREX) 200 mg Oral Capsule    Take 1 Capsule (200 mg total) by mouth Twice daily    diphenhydrAMINE (SLEEP AID, DIPHENHYDRAMINE,) 25 mg Oral Capsule    Take 1 Capsule (25 mg total) by mouth Every evening    gabapentin (NEURONTIN) 400 mg Oral Capsule    Take 1 Capsule (400 mg total) by mouth 5 times a day    levoFLOXacin (LEVAQUIN) 500 mg Oral Tablet    Take 1 Tablet (500 mg total) by mouth Once a day for 3 days Take the day before your procedure, the morning of your procedure and the day after your procedure.     levomilnacipran (FETZIMA) 20 mg Oral Capsule,Sustained Action 24 hr    Take 1 Capsule (20 mg total) by mouth Once a day    lisinopriL (PRINIVIL) 20 mg Oral Tablet    Take 1 Tablet (20 mg total) by mouth Once a day    metoprolol tartrate (LOPRESSOR) 25 mg Oral Tablet    Take 1 Tablet (25 mg total) by mouth Twice daily    multivit-minerals/folic acid (ADULT ONE DAILY MULTIVITAMIN ORAL)    Take 1 Tablet by mouth Once a day    omeprazole (PRILOSEC) 40 mg Oral Capsule, Delayed Release(E.C.)    Take 1 Capsule (40 mg total) by mouth Once a day    rosuvastatin (CRESTOR) 5 mg Oral Tablet    Take 1 Tablet (5 mg total) by mouth    tamsulosin (FLOMAX) 0.4 mg Oral Capsule    Take 1 Capsule (0.4 mg total) by mouth Every evening after dinner    vitamin E 400 unit Oral Capsule    Take 1 Capsule (400 Units total) by mouth Every 7 days    ziprasidone (GEODON) 20 mg Oral Capsule  Take 1 Capsule (20 mg total) by mouth Every morning    ziprasidone (GEODON) 60 mg Oral Capsule    Take 1 Capsule (60 mg total) by mouth Every evening    zolpidem (AMBIEN) 10 mg Oral Tablet    Take 1 Tablet (10 mg total) by mouth Every night as needed for Insomnia         Gentamicin IV - Pharmacist to Dose per Protocol, , Does not apply, Daily PRN  LR premix infusion, , Intravenous, Continuous  LR premix infusion, , Intravenous, Continuous  LR premix infusion, , Intravenous, Continuous  NS flush syringe, 3 mL, Intracatheter, Q8HRS  NS flush syringe, 3 mL, Intracatheter, Q1H PRN  NS flush syringe, 3 mL, Intracatheter, Q8HRS  NS flush syringe, 3 mL, Intracatheter, Q1H PRN      Past Surgical History:   Procedure Laterality Date   . HX BACK SURGERY     . HX HERNIA REPAIR           Social History     Tobacco Use   . Smoking status: Never   . Smokeless tobacco: Never   Vaping Use   . Vaping Use: Never used   Substance Use Topics   . Alcohol use: Never   . Drug use: Never       Gen: NAD, alert  Pulm: unlabored at rest  CV: palpable pulses  Abd: soft, Nt/ND  GU: no  suprapubic tenderness, no CVAT      Assessment & Plan:   There are no active hospital problems to display for this patient.      Elevated PSA with family history of prostate cancer in brothers, prostate vaolume 28cc  . I discussed the pathophysiology and potential causes of PSA elevation, including, but not limited to malignant (prostate adenocarcinoma), benign (prostate enlargement), infectious/inflammatory (prostatitis, urinary tract infection) and iatrogenic/idiopathic (recent ejaculation, instrumentation & age-related change) etiologies. Considering patient's serum PSA within the context of his current age, overall health and desire to identify early a potentially curable malignancy, I would recommend transrectal ultrasound-guided prostate needle biopsy (TRUS-PNBx)  . Patient was counseled on the risks of TRUS-PNBx including febrile urinary tract infection with bacteremia requiring hospitalization, prolonged bleeding (hematuria, hematochezia, hematospermia), acute urinary retention, procedural discomfort/pain, anxiety related to the diagnosis as well as limitations of the currently available biopsy schema including undersampling (false negative, incorrect risk stratification), oversampling (detection of clinically-insignificant cancer), and sampling error (necessity for repeat biopsy).  . Patient was instructed on the need for the following:  . Interruption of all blood thinning agents (Aspirin, NSAIDs, anticoagulants, antiplatelets) for 7-10 days prior  . Prophylactic Levofloxacin 500 mg the day before, the day of, and the day after biopsy              Arlana Hove, DO

## 2022-05-01 NOTE — Nurses Notes (Signed)
[id number] F[address] in place. Pt stated that he has had diarrhea and he has had some bleeding since the diarrhea started. No active bleeding noted from rectum. No bleeding noted from penis.

## 2022-05-01 NOTE — Anesthesia Postprocedure Evaluation (Signed)
Anesthesia Post Op Evaluation    Patient: Michael Norton  Procedure(s):  CYSTOSCOPY; TRANSRECTAL ULTRASOUND OF PROSTATE; ULTRASOUND GUIDED PROSTATE BIOPSY    Last Vitals:Temperature: 36.4 C (97.6 F) (05/01/22 0845)  Heart Rate: 67 (05/01/22 0900)  BP (Non-Invasive): 104/73 (05/01/22 0900)  Respiratory Rate: 20 (05/01/22 0900)  SpO2: 100 % (05/01/22 0900)    No notable events documented.    Patient is sufficiently recovered from the effects of anesthesia to participate in the evaluation and has returned to their pre-procedure level.  Patient location during evaluation: PACU       Patient participation: complete - patient participated  Level of consciousness: awake and alert and responsive to verbal stimuli    Pain score: 0  Pain management: adequate  Airway patency: patent    Anesthetic complications: no  Cardiovascular status: acceptable  Respiratory status: acceptable  Hydration status: acceptable  Patient post-procedure temperature: Pt Normothermic   PONV Status: Absent

## 2022-05-01 NOTE — Discharge Instructions (Addendum)
Return to the office for your follow up appointment on July 13th, at 9:15 AM.     If any questions or concerns call the office    Follow any instructions given by Dr Fredonia Highland    Drink lots of fluids; water recommended

## 2022-05-01 NOTE — Nurses Notes (Signed)
No rectal bleeding noted.. Foreskin remains in place. No bleeding noted from penis

## 2022-05-01 NOTE — Anesthesia Preprocedure Evaluation (Addendum)
ANESTHESIA PRE-OP EVALUATION  Planned Procedure: CYSTOSCOPY; TRANSRECTAL ULTRASOUND OF PROSTATE; ULTRASOUND GUIDED PROSTATE BIOPSY (Prostate)  Review of Systems     anesthesia history negative     patient summary reviewed  nursing notes reviewed        Pulmonary  negative pulmonary ROS,    Cardiovascular    Hypertension and ECG reviewed , Exercise Tolerance: > or = 4 METS        GI/Hepatic/Renal   negative GI/hepatic/renal ROS,         Endo/Other   neg endo/other ROS,       Neuro/Psych/MS    psychiatric history, bipolar disorder     Cancer    negative hematology/oncology ROS,                   Physical Assessment      Airway       Mallampati: III    TM distance: >3 FB    Mouth Opening: poor.            Dental           (+) caps           Pulmonary           Cardiovascular             Other findings            Plan  ASA 2     Planned anesthesia type: MAC                           Anesthetic plan and risks discussed with patient             Patient's NPO status is appropriate for Anesthesia.

## 2022-05-02 LAB — PROSTATE BIOPSY SPECIMENS: Clinical History: ELEVATED

## 2022-05-09 ENCOUNTER — Encounter (INDEPENDENT_AMBULATORY_CARE_PROVIDER_SITE_OTHER): Payer: Self-pay | Admitting: Student in an Organized Health Care Education/Training Program

## 2022-05-09 ENCOUNTER — Ambulatory Visit (INDEPENDENT_AMBULATORY_CARE_PROVIDER_SITE_OTHER): Payer: Medicare Other | Admitting: Student in an Organized Health Care Education/Training Program

## 2022-05-09 ENCOUNTER — Other Ambulatory Visit: Payer: Self-pay

## 2022-05-09 VITALS — BP 110/60 | HR 113 | Wt 189.0 lb

## 2022-05-09 DIAGNOSIS — N401 Enlarged prostate with lower urinary tract symptoms: Secondary | ICD-10-CM

## 2022-05-09 DIAGNOSIS — N400 Enlarged prostate without lower urinary tract symptoms: Secondary | ICD-10-CM

## 2022-05-09 DIAGNOSIS — Z8042 Family history of malignant neoplasm of prostate: Secondary | ICD-10-CM

## 2022-05-09 DIAGNOSIS — R972 Elevated prostate specific antigen [PSA]: Secondary | ICD-10-CM

## 2022-05-09 NOTE — Progress Notes (Signed)
[facility]  UROLOGY, [facility]    Progress Note    Name: Michael Norton MRN:  [id number]   Date: 05/09/2022 Age: 71 y.o.       Chief Complaint: Post Op (Post op f/u from prostate biopsy.)    Subjective:   Michael Norton is a pleasant 71 year old man with a past medical history of BPH on Flomax and elevated PSA status post negative biopsy on May 01, 2022.  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  He did well after his biopsy and denies any complaints.  This was his 1st lifetime biopsy.  He has a family history of prostate cancer in brothers. He denies fevers, chills, nausea, vomiting, hematuria, dysuria, flank pain, incontinence, dribbling, hesitancy, suprapubic pain, headaches, vision changes, shortness of breath, chest pain.  He is had 2 multiparametric MRI is 11/17/2019 in 2021 both which demonstrated PI-RADS 2 lesions.  His prostate volume is 28 cc.    PSA  01/02/21: 5.57   PSA 05/25/21: 5.34   PSA 12/26/21: 5.67   PSA 03/18/22: 6.55     Objective :  BP 110/60 (Site: Right, Patient Position: Sitting, Cuff Size: Adult)   Pulse (!) 113   Wt 85.7 kg (189 lb)   BMI 27.12 kg/m       Gen: NAD, alert  Pulm: unlabored at rest  CV: palpable pulses  Abd: soft, Nt/ND  GU: no suprapubic tenderness, no CVAT    Data reviewed:    Current Outpatient Medications   Medication Sig   . ascorbic acid, vitamin C, (VITAMIN C) 500 mg Oral Tablet Take 1 Tablet (500 mg total) by mouth Once a day   . celecoxib (CELEBREX) 200 mg Oral Capsule Take 1 Capsule (200 mg total) by mouth Twice daily   . diphenhydrAMINE (SLEEP AID, DIPHENHYDRAMINE,) 25 mg Oral Capsule Take 1 Capsule (25 mg total) by mouth Every evening   . gabapentin (NEURONTIN) 400 mg Oral Capsule Take 1 Capsule (400 mg total) by mouth 5 times a day   . levomilnacipran (FETZIMA) 20 mg Oral Capsule,Sustained Action 24 hr Take 1 Capsule (20 mg total) by mouth Once a day   .  lisinopriL (PRINIVIL) 20 mg Oral Tablet Take 1 Tablet (20 mg total) by mouth Once a day   . metoprolol tartrate (LOPRESSOR) 25 mg Oral Tablet Take 1 Tablet (25 mg total) by mouth Twice daily   . multivit-minerals/folic acid (ADULT ONE DAILY MULTIVITAMIN ORAL) Take 1 Tablet by mouth Once a day   . omeprazole (PRILOSEC) 40 mg Oral Capsule, Delayed Release(E.C.) Take 1 Capsule (40 mg total) by mouth Once a day   . rosuvastatin (CRESTOR) 5 mg Oral Tablet Take 1 Tablet (5 mg total) by mouth   . tamsulosin (FLOMAX) 0.4 mg Oral Capsule Take 1 Capsule (0.4 mg total) by mouth Every evening after dinner   . vitamin E 400 unit Oral Capsule Take 1 Capsule (400 Units total) by mouth Every 7 days   . ziprasidone (GEODON) 20 mg Oral Capsule Take 1 Capsule (20 mg total) by mouth Every morning   . ziprasidone (GEODON) 60 mg Oral Capsule Take 1 Capsule (60 mg total) by mouth Every evening   . zolpidem (AMBIEN) 10 mg Oral Tablet Take 1 Tablet (10 mg total) by mouth Every night as needed for Insomnia     Assessment/Plan  Problem List Items Addressed This Visit    None  Elevated PSA with family history of prostate cancer in 2 brothers status post negative prostate needle biopsy on May 01, 2022; 28cc volume  . Based on patient's clinical findings including his recent prostate specific antigen level, age, ethnicity and relevant risk factors and in accordance with the American Urological Association (AUA) & National Comprehensive Cancer Network (NCCN) published screening guidelines, I would recommend the following:  . Continued annual prostate specific antigen & digital rectal exam screening with cessation of screening at age 100 or upon developing more serious health issues.  Marland Kitchen PSA in one year        BPH/LUTS on Flomax  . Discussed the differential diagnosis, pathophysiology and nature of benign prostate enlargement causing lower urinary tract symptoms  . Counseled patient on conservative management options including appropriate fluid  management, avoidance of diuretics including caffeine and alcohol  . Continue Tamsulosin Hydrochloride (FlomaxT) 0.4 mg P.O. daily:    . I have discussed in great detail with the patient the treatment of prostate enlargement/lower urinary tract symptoms using tamsulosin.  I have explained my rationale for using tamsulosin as well as potential risks of asthenia (2%), dizziness (5%), rhinitis (3%) and abnormal ejaculation (11%). I encouraged patient to report any prior or current history of alpha-1 antagonist use to their opthalmic surgeon before undergoing any eye surgery due to the risk of intraoperative floppy iris syndrome.  The patient expressed an understanding of the treatment, possible reactions, and possible prognosis.        Arlana Hove, DO     A combined total of 45 minutes were spent preparing to see the patient, reviewing previous records, ordering tests/medications/procedures, documenting the clinical encounter as well as performing a medically appropriate evaluation and independently interpreting results and communicating them to the patient/family/caregiver as specifically outlined above in the impression and plan.

## 2022-05-15 ENCOUNTER — Other Ambulatory Visit (INDEPENDENT_AMBULATORY_CARE_PROVIDER_SITE_OTHER): Payer: Self-pay | Admitting: Family

## 2022-05-22 DIAGNOSIS — G4733 Obstructive sleep apnea (adult) (pediatric): Secondary | ICD-10-CM | POA: Insufficient documentation

## 2022-07-23 ENCOUNTER — Encounter (INDEPENDENT_AMBULATORY_CARE_PROVIDER_SITE_OTHER): Payer: Self-pay | Admitting: Family

## 2022-07-24 ENCOUNTER — Encounter (INDEPENDENT_AMBULATORY_CARE_PROVIDER_SITE_OTHER): Payer: Medicare Other | Admitting: Family

## 2022-09-19 ENCOUNTER — Other Ambulatory Visit (INDEPENDENT_AMBULATORY_CARE_PROVIDER_SITE_OTHER): Payer: Self-pay | Admitting: Student in an Organized Health Care Education/Training Program

## 2022-11-08 ENCOUNTER — Other Ambulatory Visit (HOSPITAL_COMMUNITY): Payer: Self-pay

## 2022-11-08 DIAGNOSIS — M542 Cervicalgia: Secondary | ICD-10-CM

## 2022-11-19 ENCOUNTER — Inpatient Hospital Stay
Admission: RE | Admit: 2022-11-19 | Discharge: 2022-11-19 | Disposition: A | Discharge status: Home / Self Care | Payer: Medicare Other | Source: Ambulatory Visit

## 2022-11-19 ENCOUNTER — Other Ambulatory Visit: Payer: Self-pay

## 2022-11-19 DIAGNOSIS — M542 Cervicalgia: Secondary | ICD-10-CM | POA: Insufficient documentation

## 2022-11-29 IMAGING — MR MRI LUMBAR SPINE WITHOUT CONTRAST
4 of 6 series · 30 of 48 positions shown · IV contrast (gadolinium)
Comparison: MRI lumbar spine dated 01/22/2021.

﻿EXAM:  [id number]   MRI LUMBAR SPINE WITHOUT CONTRAST
INDICATION: 71-year-old with worsening low back pain.  Radicular symptoms to right foot.  Remote past history of back surgery.  No malignancy.
TECHNIQUE: Multiplanar, multisequential MRI of the lumbosacral spine was performed without gadolinium contrast.

[Series 6: T2 · sagittal · 4.0mm · 0.94mm/px · 6 of 13 slices shown (1 of 3)]
[im 1/13]
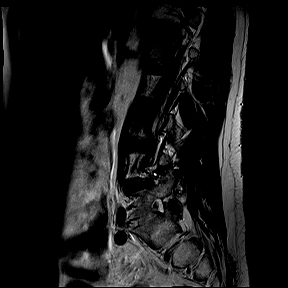
[im 3/13]
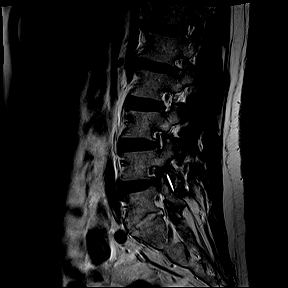
[im 5/13]
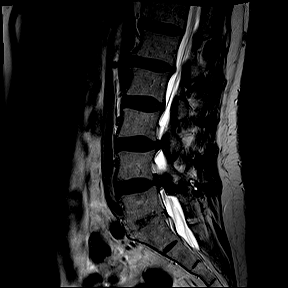
[im 8/13]
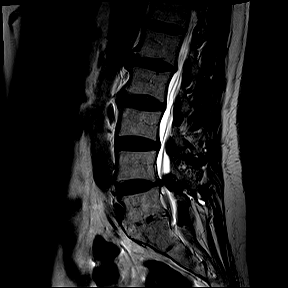
[im 10/13]
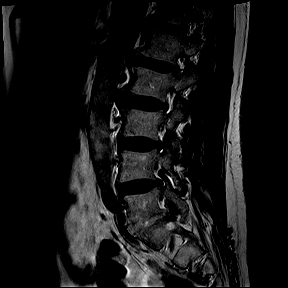
[im 13/13]
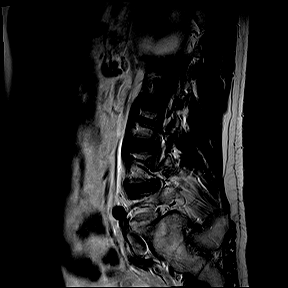

[Series 7: T1 · sagittal · 4.0mm · 0.94mm/px · 5 of 13 slices shown]
[im 1/13]
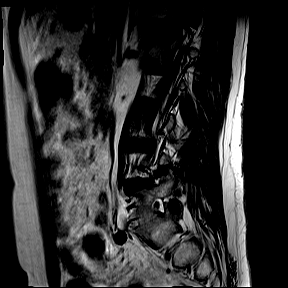
[im 3/13]
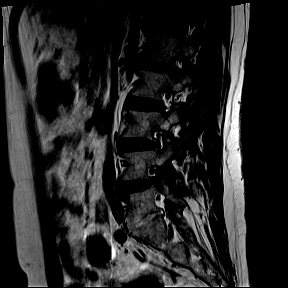
[im 5/13]
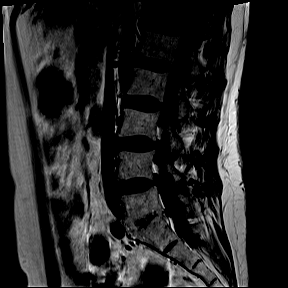
[im 8/13]
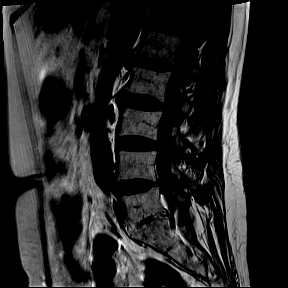
[im 13/13]
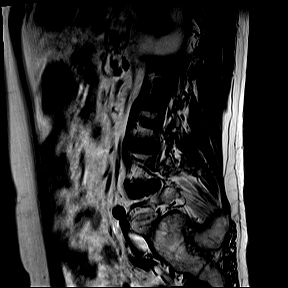

[Series 9: T2 · coronal · 5.0mm · 0.82mm/px · 8 of 18 slices shown (2 of 3)]
[im 1/18]
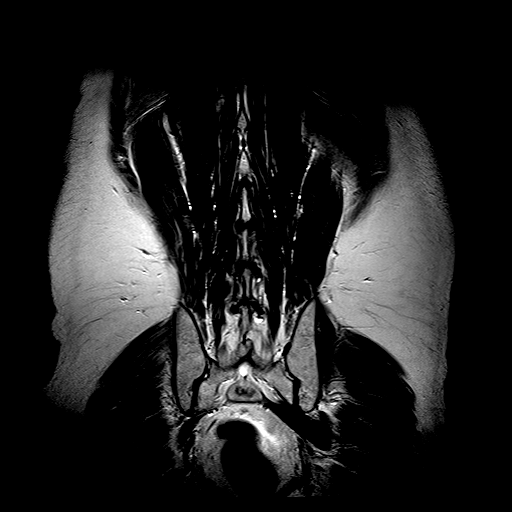
[im 3/18]
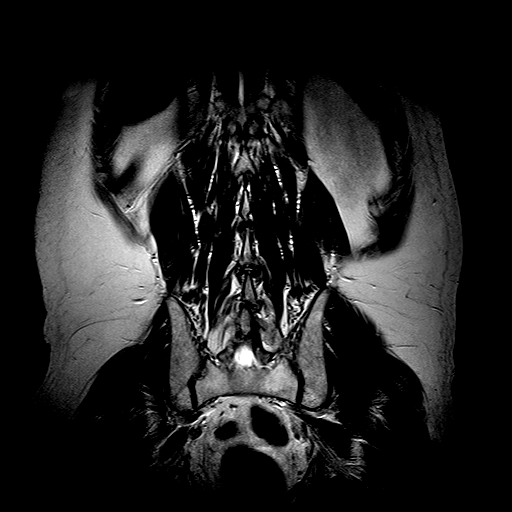
[im 5/18]
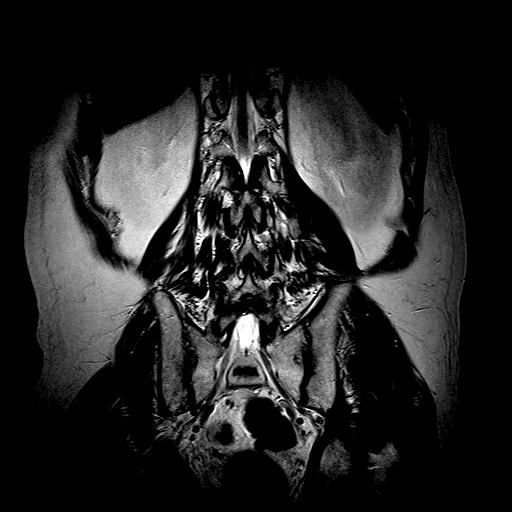
[im 8/18]
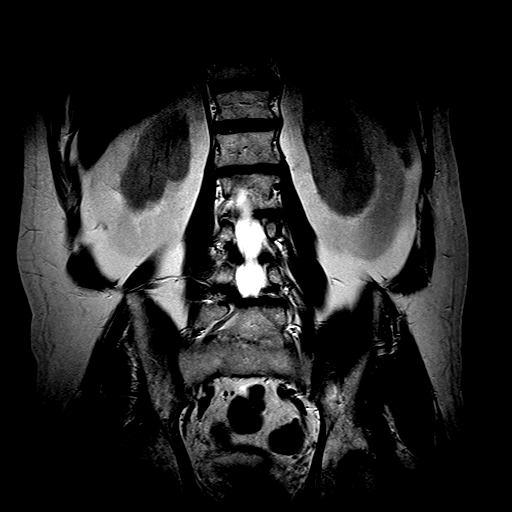
[im 10/18]
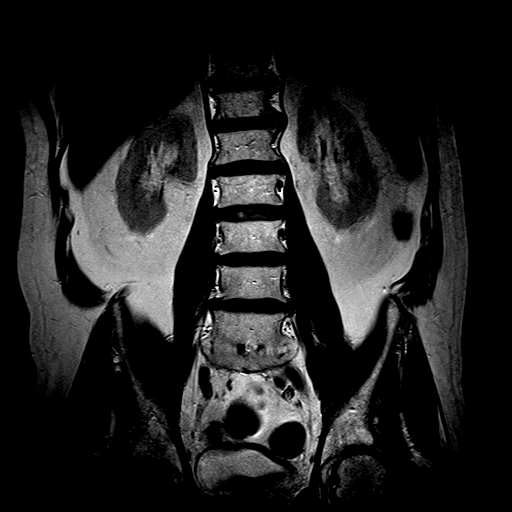
[im 13/18]
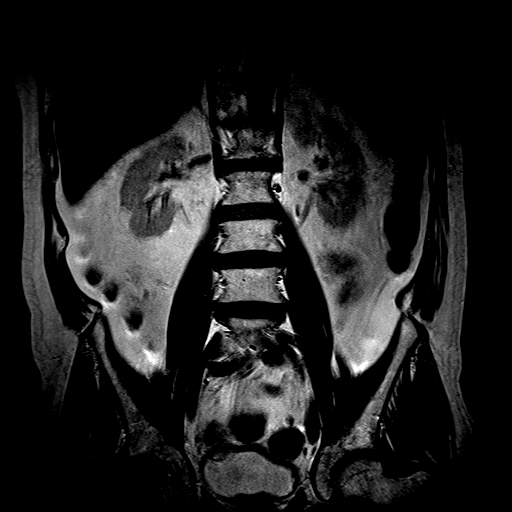
[im 15/18]
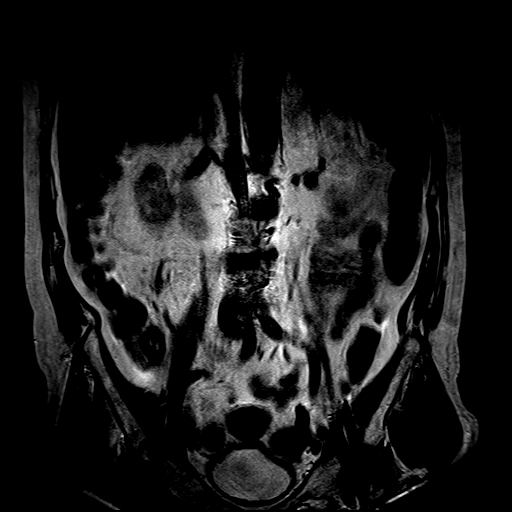
[im 18/18]
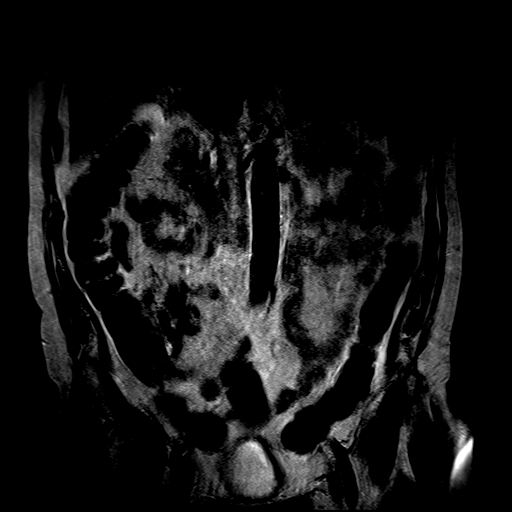

[Series 10: T2 · axial · 4.0mm · 0.52mm/px · z∈[-86,+122]mm · 11 of 23 slices shown (3 of 3)]
[im 1/23]
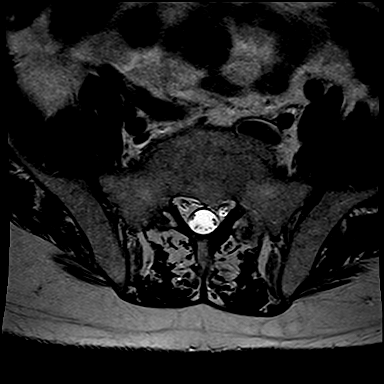
[im 3/23]
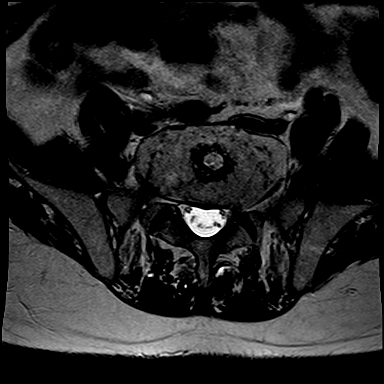
[im 5/23]
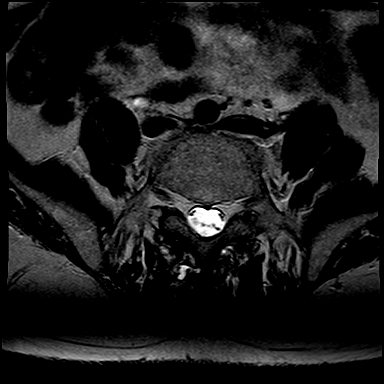
[im 7/23]
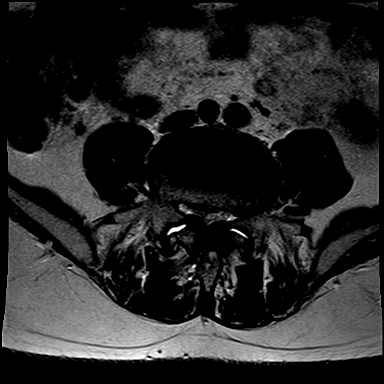
[im 9/23]
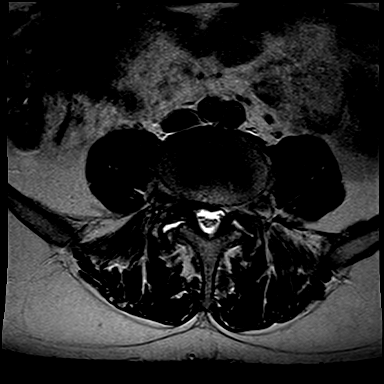
[im 12/23]
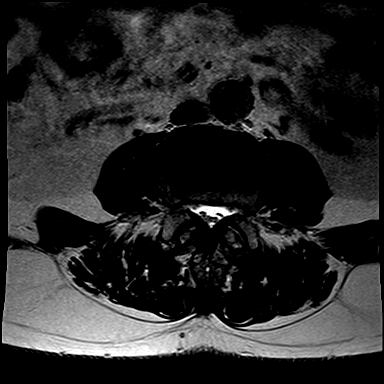
[im 14/23]
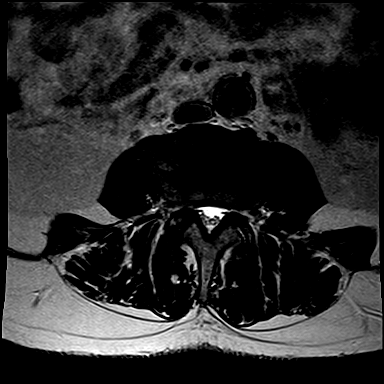
[im 16/23]
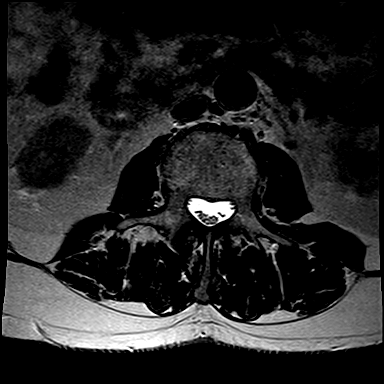
[im 18/23]
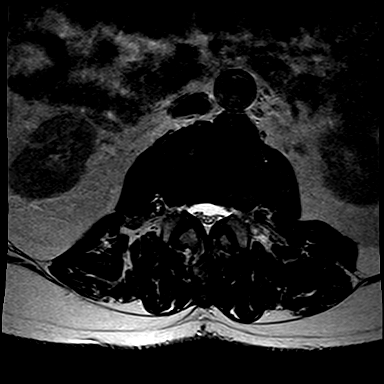
[im 20/23]
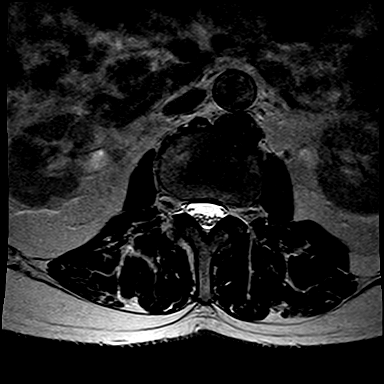
[im 23/23]
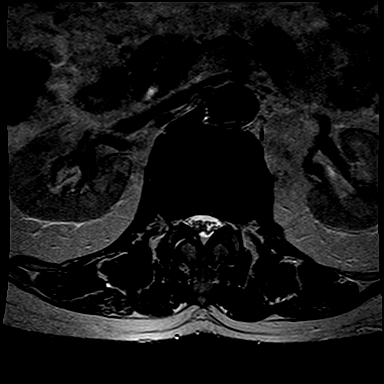

[30 of 48 positions shown; findings below may reference images not displayed]

FINDINGS: No acute bony lesions of lumbar vertebrae are seen.

At L1-2 level, degenerative disc disease of mild degree with facet arthropathy causing mild to moderate compromise of thecal sac and both lateral recesses unchanged in appearance from 01/22/2021. 

At L2-3 level, mild degenerative disc disease and facet arthropathy causing mild compromise of both lateral recesses similar to previous examination.

At L3-4 level, degenerative disc disease and significant bilateral facet arthropathy are noted causing significant compromise of both lateral recesses and neural foramina and moderate compromise of thecal sac with AP diameter in the midline measuring 7.3 mm.  Findings at L3-4 level are slightly more prominent compared with 01/22/2021. 

At L4-5 level, severe facet arthropathy with hypertrophy of ligaments and degenerative disc disease are causing severe degree of spinal stenosis and compromise of both lateral recesses and neural foramina. AP diameter of thecal sac in the midline measures 5.4 mm.  Findings are unchanged from previous examination of 01/22/2021. 

Stable postsurgical changes at L5-S1 level.  Biforaminal narrowing due to bulging annulus at this level.

Paravertebral soft tissues are unremarkable.
IMPRESSION: 1.  Postsurgical changes at L5-S1 level.

2.  At L3-4 level, degenerative disc disease and significant bilateral facet arthropathy are noted causing significant compromise of both lateral recesses and neural foramina and moderate compromise of thecal sac with AP diameter in the midline measuring 7.3 mm.  Findings at L3-4 level are slightly more prominent compared with 01/22/2021. 

3. At L4-5 level, severe facet arthropathy with hypertrophy of ligaments and degenerative disc disease are causing severe degree of spinal stenosis and compromise of both lateral recesses and neural foramina. AP diameter of thecal sac in the midline measures 5.4 mm.  Findings are unchanged from previous examination of 01/22/2021. 

4. Findings at other disc levels are unchanged from previous study.

Electronically Signed by NAKA, KIYOON at [id number] [DATE]

## 2023-01-16 ENCOUNTER — Ambulatory Visit (INDEPENDENT_AMBULATORY_CARE_PROVIDER_SITE_OTHER): Payer: Medicare Other | Admitting: Student in an Organized Health Care Education/Training Program

## 2023-01-16 ENCOUNTER — Other Ambulatory Visit: Payer: Self-pay

## 2023-01-16 ENCOUNTER — Encounter (INDEPENDENT_AMBULATORY_CARE_PROVIDER_SITE_OTHER): Payer: Self-pay | Admitting: Student in an Organized Health Care Education/Training Program

## 2023-01-16 VITALS — BP 153/86 | HR 87 | Ht 70.0 in | Wt 196.0 lb

## 2023-01-16 DIAGNOSIS — N401 Enlarged prostate with lower urinary tract symptoms: Secondary | ICD-10-CM

## 2023-01-16 DIAGNOSIS — Z8042 Family history of malignant neoplasm of prostate: Secondary | ICD-10-CM

## 2023-01-16 DIAGNOSIS — R972 Elevated prostate specific antigen [PSA]: Secondary | ICD-10-CM

## 2023-01-16 DIAGNOSIS — N400 Enlarged prostate without lower urinary tract symptoms: Secondary | ICD-10-CM

## 2023-01-16 DIAGNOSIS — R350 Frequency of micturition: Secondary | ICD-10-CM

## 2023-01-16 NOTE — Progress Notes (Unsigned)
UROLOGY, [facility]  [address]  [place] [address]    Progress Note    Name: Michael Norton MRN:  [id number]   Date: 01/16/2023 DOB:  Nov 16, 1950 (72 y.o.)           Chief Complaint: Elevated PSA (Pt here for eval of elevated PSA. )    Subjective:   Michael Norton is a pleasant 72 year old man with a past medical history of BPH on Flomax and elevated PSA status post negative biopsy on May 01, 2022.  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  He did well after his biopsy and denies any complaints.  This was his 1st lifetime biopsy.  He has a family history of prostate cancer in brothers. He denies fevers, chills, nausea, vomiting, hematuria, dysuria, flank pain, incontinence, dribbling, hesitancy, suprapubic pain, headaches, vision changes, shortness of breath, chest pain.  He is had 2 multiparametric MRI is 11/16/2018 in 2021 both which demonstrated PI-RADS 2 lesions.  His prostate volume is 28 cc.     He recently had a PSA which was elevated at 10.6.  This was done as preoperative test preparation for upcoming spinal surgery.       PSA  01/02/21: 5.57   PSA 05/25/21: 5.34   PSA 12/26/21: 5.67   PSA 03/18/22: 6.55   PSA 11/14/2022 10.6      Objective :  BP (!) 153/86 (Site: Right, Patient Position: Sitting, Cuff Size: Adult)   Pulse 87   Ht 1.778 m (5\' 10" )   Wt 88.9 kg (196 lb)   BMI 28.12 kg/m       Gen: NAD, alert  Pulm: unlabored at rest  CV: palpable pulses  Abd: soft, Nt/ND  GU: no suprapubic tenderness, no CVAT        Data reviewed:    Current Outpatient Medications   Medication Sig    ascorbic acid, vitamin C, (VITAMIN C) 500 mg Oral Tablet Take 1 Tablet (500 mg total) by mouth Once a day    celecoxib (CELEBREX) 200 mg Oral Capsule Take 1 Capsule (200 mg total) by mouth Twice daily    diphenhydrAMINE (SLEEP AID, DIPHENHYDRAMINE,) 25 mg Oral Capsule Take 1 Capsule (25 mg total) by mouth Every evening     gabapentin (NEURONTIN) 400 mg Oral Capsule Take 1 Capsule (400 mg total) by mouth 5 times a day    levomilnacipran (FETZIMA) 20 mg Oral Capsule,Sustained Action 24 hr Take 1 Capsule (20 mg total) by mouth Once a day    lisinopriL (PRINIVIL) 20 mg Oral Tablet Take 1 Tablet (20 mg total) by mouth Once a day    metoprolol tartrate (LOPRESSOR) 25 mg Oral Tablet Take 1 Tablet (25 mg total) by mouth Twice daily    multivit-minerals/folic acid (ADULT ONE DAILY MULTIVITAMIN ORAL) Take 1 Tablet by mouth Once a day    omeprazole (PRILOSEC) 40 mg Oral Capsule, Delayed Release(E.C.) Take 1 Capsule (40 mg total) by mouth Once a day    rosuvastatin (CRESTOR) 5 mg Oral Tablet Take 1 Tablet (5 mg total) by mouth    tamsulosin (FLOMAX) 0.4 mg Oral Capsule TAKE 1 CAPSULE BY MOUTH EVERYDAY AT BEDTIME    vitamin E 400 unit Oral Capsule Take 1 Capsule (400 Units total) by mouth Every 7 days    ziprasidone (GEODON) 20 mg Oral Capsule Take 1 Capsule (20 mg total) by mouth Every morning  ziprasidone (GEODON) 60 mg Oral Capsule Take 1 Capsule (60 mg total) by mouth Every evening    zolpidem (AMBIEN) 10 mg Oral Tablet Take 1 Tablet (10 mg total) by mouth Every night as needed for Insomnia     Assessment/Plan  Problem List Items Addressed This Visit    None      Elevated PSA with family history of prostate cancer in 2 brothers status post negative prostate needle biopsy on May 01, 2022 with two negative MRIs in 2020 & 2021; 28cc volume  Based on patient's clinical findings including his recent prostate specific antigen level, age, ethnicity and relevant risk factors and in accordance with the American Urological Association (AUA) & [place] (NCCN) published screening guidelines, I would recommend the following:  Continued annual prostate specific antigen & digital rectal exam screening with cessation of screening at age 72 or upon developing more serious health issues.  PSA in 6 months   Suspect his current  elevated PSA is isolated and due to non malignant causes such as inflammation dysfunctional voiding.  This is due to his recent negative biopsy and 2 negative MRI.  Plan for repeat PSA in 6 months           BPH/LUTS on Flomax  Discussed the differential diagnosis, pathophysiology and nature of benign prostate enlargement causing lower urinary tract symptoms  Counseled patient on conservative management options including appropriate fluid management, avoidance of diuretics including caffeine and alcohol  Continue Tamsulosin Hydrochloride (FlomaxT) 0.4 mg P.O. daily:    I have discussed in great detail with the patient the treatment of prostate enlargement/lower urinary tract symptoms using tamsulosin.  I have explained my rationale for using tamsulosin as well as potential risks of asthenia (2%), dizziness (5%), rhinitis (3%) and abnormal ejaculation (11%). I encouraged patient to report any prior or current history of alpha-1 antagonist use to their opthalmic surgeon before undergoing any eye surgery due to the risk of intraoperative floppy iris syndrome.  The patient expressed an understanding of the treatment, possible reactions, and possible prognosis.        Michael Gandy, DO     A combined total of 45 minutes were spent preparing to see the patient, reviewing previous records, ordering tests/medications/procedures, documenting the clinical encounter as well as performing a medically appropriate evaluation and independently interpreting results and communicating them to the patient/family/caregiver as specifically outlined above in the impression and plan.    This note may have been fully or partially generated using MModal Fluency Direct system, and there may be some incorrect words, spellings, and punctuation that were not identified in checking the note before saving.

## 2023-02-21 DIAGNOSIS — M4712 Other spondylosis with myelopathy, cervical region: Secondary | ICD-10-CM | POA: Insufficient documentation

## 2023-05-06 ENCOUNTER — Other Ambulatory Visit: Payer: Self-pay

## 2023-05-06 ENCOUNTER — Other Ambulatory Visit: Payer: Medicare Other | Attending: Student in an Organized Health Care Education/Training Program

## 2023-05-06 DIAGNOSIS — R972 Elevated prostate specific antigen [PSA]: Secondary | ICD-10-CM | POA: Insufficient documentation

## 2023-05-06 LAB — PSA, DIAGNOSTIC: PSA: 11.45 ng/mL — ABNORMAL HIGH (ref <=4.00)

## 2023-05-08 ENCOUNTER — Telehealth (INDEPENDENT_AMBULATORY_CARE_PROVIDER_SITE_OTHER): Payer: Medicare Other | Admitting: Student in an Organized Health Care Education/Training Program

## 2023-05-09 ENCOUNTER — Other Ambulatory Visit (INDEPENDENT_AMBULATORY_CARE_PROVIDER_SITE_OTHER): Payer: Self-pay | Admitting: Student in an Organized Health Care Education/Training Program

## 2023-05-09 HISTORY — PX: NECK SURGERY: SHX720

## 2023-05-30 ENCOUNTER — Encounter (INDEPENDENT_AMBULATORY_CARE_PROVIDER_SITE_OTHER): Payer: Self-pay | Admitting: Student in an Organized Health Care Education/Training Program

## 2023-07-24 ENCOUNTER — Encounter (INDEPENDENT_AMBULATORY_CARE_PROVIDER_SITE_OTHER): Payer: Self-pay | Admitting: Student in an Organized Health Care Education/Training Program

## 2023-08-08 ENCOUNTER — Encounter (INDEPENDENT_AMBULATORY_CARE_PROVIDER_SITE_OTHER): Payer: Medicare Other | Admitting: Student in an Organized Health Care Education/Training Program

## 2023-08-19 ENCOUNTER — Other Ambulatory Visit (INDEPENDENT_AMBULATORY_CARE_PROVIDER_SITE_OTHER): Payer: Self-pay | Admitting: Student in an Organized Health Care Education/Training Program

## 2023-08-22 ENCOUNTER — Ambulatory Visit (INDEPENDENT_AMBULATORY_CARE_PROVIDER_SITE_OTHER): Payer: Medicare Other | Admitting: Student in an Organized Health Care Education/Training Program

## 2023-08-22 ENCOUNTER — Encounter (INDEPENDENT_AMBULATORY_CARE_PROVIDER_SITE_OTHER): Payer: Self-pay | Admitting: Student in an Organized Health Care Education/Training Program

## 2023-08-22 ENCOUNTER — Other Ambulatory Visit: Payer: Self-pay

## 2023-08-22 VITALS — BP 109/68 | HR 91 | Ht 70.0 in | Wt 186.0 lb

## 2023-08-22 DIAGNOSIS — R972 Elevated prostate specific antigen [PSA]: Secondary | ICD-10-CM

## 2023-08-22 DIAGNOSIS — Z8042 Family history of malignant neoplasm of prostate: Secondary | ICD-10-CM

## 2023-08-22 DIAGNOSIS — N400 Enlarged prostate without lower urinary tract symptoms: Secondary | ICD-10-CM

## 2023-08-22 NOTE — Progress Notes (Signed)
UROLOGY, [facility]  [address] [place] [address]    Progress Note    Name: Michael Norton MRN:  [id number]   Date: 08/22/2023 DOB:  Feb 04, 1951 (72 y.o.)             Chief Complaint: Follow Up (BPH/LUTS)  Subjective   Subjective:   Michael Norton is a pleasant 72 year old man with a past medical history of BPH on Flomax and elevated PSA status post negative biopsy on May 01, 2022.  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  He did well after his biopsy and denies any complaints.  This was his 1st lifetime biopsy.  He has a family history of prostate cancer in brothers. He denies fevers, chills, nausea, vomiting, hematuria, dysuria, flank pain, incontinence, dribbling, hesitancy, suprapubic pain, headaches, vision changes, shortness of breath, chest pain.  He is had 2 multiparametric MRI is 11/16/2018 in 2021 both which demonstrated PI-RADS 2 lesions.  His prostate volume is 28 cc.      He recently had a PSA which was elevated at 10.6.  This was done as preoperative test preparation for upcoming spinal surgery. He recently completed cervical surgery with cages placed. His most recent PSA is 11.45. He denies urinary complaints. He denies fevers, chills, nausea, vomiting, hematuria, dysuria, flank pain, incontinence, dribbling, hesitancy, suprapubic pain, headaches, vision changes, shortness of breath, chest pain.          PSA  01/02/21: 5.57   PSA 05/25/21: 5.34   PSA 12/26/21: 5.67   PSA 03/18/22: 6.55   PSA 11/14/2022 10.6    PROSTATE SPECIFIC ANTIGEN   Lab Results   Component Value Date/Time    PROSSPECAG 11.45 (H) 05/06/2023 09:11 AM    PROSSPECAG 6.55 (H) 03/18/2022 08:34 AM    PROSSPECAG 5.67 (H) 01/15/2022 09:09 AM                Objective   Objective :  BP 109/68 (Site: Right Arm, Patient Position: Sitting, Cuff Size: Adult)   Pulse 91   Ht 1.778 m (5\' 10" )   Wt 84.4 kg (186 lb)   BMI 26.69 kg/m        Gen: NAD, alert  Pulm: unlabored at rest  CV: palpable pulses  Abd: soft, Nt/ND  GU: no suprapubic tenderness, no CVAT, prostate smooth non-tender, mild firmness along right border, no definitive nodules        Data reviewed:    Current Outpatient Medications   Medication Sig    ascorbic acid, vitamin C, (VITAMIN C) 500 mg Oral Tablet Take 1 Tablet (500 mg total) by mouth Once a day    celecoxib (CELEBREX) 200 mg Oral Capsule Take 1 Capsule (200 mg total) by mouth Twice daily    diclofenac sodium (VOLTAREN) 1 % Gel Apply 2 g topically    diphenhydrAMINE (SLEEP AID, DIPHENHYDRAMINE,) 25 mg Oral Capsule Take 1 Capsule (25 mg total) by mouth Every evening    famotidine (PEPCID) 40 mg Oral Tablet TAKE 1 TABLET BY MOUTH DAILY IN THE MORNING    gabapentin (NEURONTIN) 400 mg Oral Capsule Take 1 Capsule (400 mg total) by mouth 5 times a day    levomilnacipran (FETZIMA) 20 mg Oral Capsule,Sustained Action 24 hr Take 1 Capsule (20 mg total) by mouth Once a day    lisinopriL (PRINIVIL) 20 mg Oral Tablet Take 1 Tablet (20 mg total) by mouth  Once a day    metoprolol tartrate (LOPRESSOR) 25 mg Oral Tablet Take 1 Tablet (25 mg total) by mouth Twice daily    multivit-minerals/folic acid (ADULT ONE DAILY MULTIVITAMIN ORAL) Take 1 Tablet by mouth Once a day    omeprazole (PRILOSEC) 40 mg Oral Capsule, Delayed Release(E.C.) Take 1 Capsule (40 mg total) by mouth Once a day    rosuvastatin (CRESTOR) 5 mg Oral Tablet Take 1 Tablet (5 mg total) by mouth    tamsulosin (FLOMAX) 0.4 mg Oral Capsule TAKE 1 CAPSULE BY MOUTH EVERYDAY AT BEDTIME    vitamin E 400 unit Oral Capsule Take 1 Capsule (400 Units total) by mouth Every 7 days    ziprasidone (GEODON) 20 mg Oral Capsule Take 1 Capsule (20 mg total) by mouth Every morning    ziprasidone (GEODON) 60 mg Oral Capsule Take 1 Capsule (60 mg total) by mouth Every evening    zolpidem (AMBIEN) 10 mg Oral Tablet Take 1 Tablet (10 mg total) by mouth Every night as needed for Insomnia         Assessment/Plan  Problem List Items Addressed This Visit          Urology    BPH (benign prostatic hyperplasia)    Relevant Orders    POCT URINE DIPSTICK    Elevated PSA - Primary     Other Visit Diagnoses       Family history of prostate cancer                Elevated PSA with family history of prostate cancer in 2 brothers status post negative prostate needle biopsy on May 01, 2022 with two negative MRIs in 2020 & 2021; 28cc volume  I would recommend dynamic contrast enhanced multiparametric prostate MRI as a means of future prostate biopsy and possible MR-Ultrasound fusion targeted biopsy planning due to patient's rising PSA, high PSA density & previous negative biopsy(s)  Risks inherent to prostate magnetic resonance imaging including intravenous gadolinium as well as transient discomfort from the endorectal coil were discussed with patient in detail             BPH/LUTS on Flomax  Discussed the differential diagnosis, pathophysiology and nature of benign prostate enlargement causing lower urinary tract symptoms  Counseled patient on conservative management options including appropriate fluid management, avoidance of diuretics including caffeine and alcohol  Refill provided for Tamsulosin Hydrochloride (FlomaxT) 0.4 mg P.O. daily:    I have discussed in great detail with the patient the treatment of prostate enlargement/lower urinary tract symptoms using tamsulosin.  I have explained my rationale for using tamsulosin as well as potential risks of asthenia (2%), dizziness (5%), rhinitis (3%) and abnormal ejaculation (11%). I encouraged patient to report any prior or current history of alpha-1 antagonist use to their opthalmic surgeon before undergoing any eye surgery due to the risk of intraoperative floppy iris syndrome.  The patient expressed an understanding of the treatment, possible reactions, and possible prognosis.         Arlana Hove, DO     A combined total of 30 minutes were spent preparing to see  the patient, reviewing previous records, ordering tests/medications/procedures, documenting the clinical encounter as well as performing a medically appropriate evaluation and independently interpreting results and communicating them to the patient/family/caregiver as specifically outlined above in the impression and plan.    This note may have been fully or partially generated using MModal Fluency Direct system, and there may be some incorrect words, spellings, and  punctuation that were not identified in checking the note before saving.

## 2023-09-17 ENCOUNTER — Other Ambulatory Visit: Payer: Self-pay

## 2023-09-17 ENCOUNTER — Inpatient Hospital Stay
Admission: RE | Admit: 2023-09-17 | Discharge: 2023-09-17 | Disposition: A | Discharge status: Home / Self Care | Payer: Medicare Other | Source: Ambulatory Visit | Attending: Student in an Organized Health Care Education/Training Program | Admitting: Student in an Organized Health Care Education/Training Program

## 2023-09-17 DIAGNOSIS — N400 Enlarged prostate without lower urinary tract symptoms: Secondary | ICD-10-CM | POA: Insufficient documentation

## 2023-09-17 DIAGNOSIS — R972 Elevated prostate specific antigen [PSA]: Secondary | ICD-10-CM | POA: Insufficient documentation

## 2023-09-17 DIAGNOSIS — Z8042 Family history of malignant neoplasm of prostate: Secondary | ICD-10-CM | POA: Insufficient documentation

## 2023-09-17 MED ORDER — GADOBUTROL 10 MMOL/10 ML (1 MMOL/ML) INTRAVENOUS SOLUTION
10.0000 mL | INTRAVENOUS | Status: AC
Start: 2023-09-17 — End: 2023-09-17
  Administered 2023-09-17: 8 mL via INTRAVENOUS

## 2023-09-19 ENCOUNTER — Encounter (INDEPENDENT_AMBULATORY_CARE_PROVIDER_SITE_OTHER): Payer: Self-pay | Admitting: Student in an Organized Health Care Education/Training Program

## 2023-09-19 DIAGNOSIS — N400 Enlarged prostate without lower urinary tract symptoms: Secondary | ICD-10-CM

## 2023-09-19 DIAGNOSIS — R972 Elevated prostate specific antigen [PSA]: Secondary | ICD-10-CM

## 2023-09-19 DIAGNOSIS — Z8042 Family history of malignant neoplasm of prostate: Secondary | ICD-10-CM

## 2023-09-23 ENCOUNTER — Other Ambulatory Visit: Payer: Self-pay

## 2023-09-23 ENCOUNTER — Ambulatory Visit (INDEPENDENT_AMBULATORY_CARE_PROVIDER_SITE_OTHER): Payer: Medicare Other | Admitting: Student in an Organized Health Care Education/Training Program

## 2023-09-23 ENCOUNTER — Encounter (INDEPENDENT_AMBULATORY_CARE_PROVIDER_SITE_OTHER): Payer: Self-pay | Admitting: Student in an Organized Health Care Education/Training Program

## 2023-09-23 VITALS — BP 118/71 | HR 88 | Ht 70.0 in | Wt 187.8 lb

## 2023-09-23 DIAGNOSIS — N400 Enlarged prostate without lower urinary tract symptoms: Secondary | ICD-10-CM

## 2023-09-23 DIAGNOSIS — R972 Elevated prostate specific antigen [PSA]: Secondary | ICD-10-CM

## 2023-09-23 DIAGNOSIS — Z8042 Family history of malignant neoplasm of prostate: Secondary | ICD-10-CM

## 2023-09-23 DIAGNOSIS — Z01818 Encounter for other preprocedural examination: Secondary | ICD-10-CM

## 2023-09-23 MED ORDER — DOXYCYCLINE HYCLATE 100 MG CAPSULE
100.0000 mg | ORAL_CAPSULE | Freq: Two times a day (BID) | ORAL | 0 refills | Status: DC
Start: 2023-09-23 — End: 2023-10-20

## 2023-09-23 NOTE — Progress Notes (Unsigned)
UROLOGY, [facility]  [address] [place] [address]    Progress Note    Name: Michael Norton MRN:  [id number]   Date: 09/23/2023 DOB:  08-14-1951 (72 y.o.)             Chief Complaint: Follow Up (F/U on MRI of prostate. )  Subjective   Subjective:   Michael Norton is a pleasant 72 year old man with a past medical history of BPH on Flomax and elevated PSA status post negative biopsy on May 01, 2022.  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  He did well after his biopsy and denies any complaints.  This was his 1st lifetime biopsy.  He has a family history of prostate cancer in brothers. He is had 2 multiparametric MRI is 11/16/2018 in 2021 both which demonstrated PI-RADS 2 lesions.  His prostate volume is 28 cc.      He recently had a PSA which was elevated at 10.6.  This was done as preoperative test preparation for upcoming spinal surgery. He recently completed cervical surgery with cages placed. His most recent PSA is 11.45. He denies urinary complaints.     He underwent a prostate MRI in September 17, 2023 which demonstrated a 31 cc prostate with a PI-RADS 2 lesion as well as a PI-RADS 3 lesion. He denies fevers, chills, nausea, vomiting, hematuria, dysuria, flank pain, incontinence, dribbling, hesitancy, suprapubic pain, headaches, vision changes, shortness of breath, chest pain.          PSA  01/02/21: 5.57   PSA 05/25/21: 5.34   PSA 12/26/21: 5.67   PSA 03/18/22: 6.55   PSA 11/14/2022 10.6    PROSTATE SPECIFIC ANTIGEN   Lab Results   Component Value Date/Time    PROSSPECAG 11.45 (H) 05/06/2023 09:11 AM    PROSSPECAG 6.55 (H) 03/18/2022 08:34 AM    PROSSPECAG 5.67 (H) 01/15/2022 09:09 AM              Objective   Objective :  BP 118/71 (Site: Right Arm, Patient Position: Sitting, Cuff Size: Adult)   Pulse 88   Ht 1.778 m (5\' 10" )   Wt 85.2 kg (187 lb 12.8 oz)   SpO2 98%   BMI 26.95 kg/m       Gen: NAD,  alert  Pulm: unlabored at rest  CV: palpable pulses  Abd: soft, Nt/ND  GU: no suprapubic tenderness, no CVAT      Data reviewed:    Current Outpatient Medications   Medication Sig    ascorbic acid, vitamin C, (VITAMIN C) 500 mg Oral Tablet Take 1 Tablet (500 mg total) by mouth Once a day    celecoxib (CELEBREX) 200 mg Oral Capsule Take 1 Capsule (200 mg total) by mouth Twice daily    cephalexin (KEFLEX) 250 mg Oral Capsule 1 Capsule (250 mg total)    diclofenac sodium (VOLTAREN) 1 % Gel Apply 2 g topically    diphenhydrAMINE (SLEEP AID, DIPHENHYDRAMINE,) 25 mg Oral Capsule Take 1 Capsule (25 mg total) by mouth Every evening    etodolac (LODINE) 400 mg Oral Tablet Take 1 Tablet (400 mg total) by mouth Twice daily    famotidine (PEPCID) 40 mg Oral Tablet TAKE 1 TABLET BY MOUTH DAILY IN THE MORNING    gabapentin (NEURONTIN) 400 mg Oral Capsule Take 1 Capsule (400 mg total) by mouth 5 times a day    levomilnacipran (  FETZIMA) 20 mg Oral Capsule,Sustained Action 24 hr Take 1 Capsule (20 mg total) by mouth Once a day    lisinopriL (PRINIVIL) 20 mg Oral Tablet Take 1 Tablet (20 mg total) by mouth Once a day    metoprolol tartrate (LOPRESSOR) 25 mg Oral Tablet Take 1 Tablet (25 mg total) by mouth Twice daily    multivit-minerals/folic acid (ADULT ONE DAILY MULTIVITAMIN ORAL) Take 1 Tablet by mouth Once a day    multivitamin (SUPER THERA VITE M) Oral Tablet Take 1 Tablet by mouth    omeprazole (PRILOSEC) 40 mg Oral Capsule, Delayed Release(E.C.) Take 1 Capsule (40 mg total) by mouth Once a day    rosuvastatin (CRESTOR) 5 mg Oral Tablet Take 1 Tablet (5 mg total) by mouth    tamsulosin (FLOMAX) 0.4 mg Oral Capsule TAKE 1 CAPSULE BY MOUTH EVERYDAY AT BEDTIME    vitamin E 400 unit Oral Capsule Take 1 Capsule (400 Units total) by mouth Every 7 days    ziprasidone (GEODON) 20 mg Oral Capsule Take 1 Capsule (20 mg total) by mouth Every morning    ziprasidone (GEODON) 60 mg Oral Capsule Take 1 Capsule (60 mg total) by mouth Every  evening    zolpidem (AMBIEN) 10 mg Oral Tablet Take 1 Tablet (10 mg total) by mouth Every night as needed for Insomnia        Assessment/Plan  Problem List Items Addressed This Visit    None    Elevated PSA with family history of prostate cancer in 2 brothers status post negative prostate needle biopsy on May 01, 2022 with two negative MRIs in 2020 & 2021; MRI in Novemeber 2024 with PI-RADS 2&3 lesion; 31cc  I discussed the pathophysiology and potential causes of PSA elevation, including, but not limited to malignant (prostate adenocarcinoma), benign (prostate enlargement), infectious/inflammatory (prostatitis, urinary tract infection) and iatrogenic/idiopathic (recent ejaculation, instrumentation & age-related change) etiologies. Considering patient's serum PSA within the context of his current age, overall health and desire to identify early a potentially curable malignancy, I would recommend MRI-US fusion prostate biopsy  Patient was counseled on the risks of TRUS-PNBx including febrile urinary tract infection with bacteremia requiring hospitalization, prolonged bleeding (hematuria, hematochezia, hematospermia), acute urinary retention, procedural discomfort/pain, anxiety related to the diagnosis as well as limitations of the currently available biopsy schema including undersampling (false negative, incorrect risk stratification), oversampling (detection of clinically-insignificant cancer), and sampling error (necessity for repeat biopsy).  Patient was instructed on the need for the following:  Interruption of all blood thinning agents (Aspirin, NSAIDs, anticoagulants, antiplatelets) for 7-10 days prior  Prophylactic Doxycycline 100 mg BID the day before, the day of, and the day after biopsy                BPH/LUTS on Flomax  Discussed the differential diagnosis, pathophysiology and nature of benign prostate enlargement causing lower urinary tract symptoms  Counseled patient on conservative management options  including appropriate fluid management, avoidance of diuretics including caffeine and alcohol  Continue Tamsulosin Hydrochloride (FlomaxT) 0.4 mg P.O. daily:    I have discussed in great detail with the patient the treatment of prostate enlargement/lower urinary tract symptoms using tamsulosin.  I have explained my rationale for using tamsulosin as well as potential risks of asthenia (2%), dizziness (5%), rhinitis (3%) and abnormal ejaculation (11%). I encouraged patient to report any prior or current history of alpha-1 antagonist use to their opthalmic surgeon before undergoing any eye surgery due to the risk of intraoperative floppy iris  syndrome.  The patient expressed an understanding of the treatment, possible reactions, and possible prognosis.          Arlana Hove, DO     A combined total of 30 minutes were spent preparing to see the patient, reviewing previous records, ordering tests/medications/procedures, documenting the clinical encounter as well as performing a medically appropriate evaluation and independently interpreting results and communicating them to the patient/family/caregiver as specifically outlined above in the impression and plan.    This note may have been fully or partially generated using MModal Fluency Direct system, and there may be some incorrect words, spellings, and punctuation that were not identified in checking the note before saving.

## 2023-10-06 ENCOUNTER — Other Ambulatory Visit: Payer: Medicare Other | Attending: Student in an Organized Health Care Education/Training Program

## 2023-10-06 ENCOUNTER — Inpatient Hospital Stay (HOSPITAL_BASED_OUTPATIENT_CLINIC_OR_DEPARTMENT_OTHER)
Admission: RE | Admit: 2023-10-06 | Discharge: 2023-10-06 | Disposition: A | Discharge status: Home / Self Care | Payer: Medicare Other | Source: Ambulatory Visit | Attending: Student in an Organized Health Care Education/Training Program | Admitting: Student in an Organized Health Care Education/Training Program

## 2023-10-06 ENCOUNTER — Other Ambulatory Visit: Payer: Self-pay

## 2023-10-06 DIAGNOSIS — Z01818 Encounter for other preprocedural examination: Secondary | ICD-10-CM | POA: Insufficient documentation

## 2023-10-06 LAB — BASIC METABOLIC PANEL
ANION GAP: 4 mmol/L (ref 4–13)
BUN/CREA RATIO: 11 (ref 6–22)
BUN: 10 mg/dL (ref 7–25)
CALCIUM: 9.8 mg/dL (ref 8.6–10.3)
CHLORIDE: 105 mmol/L (ref 98–107)
CO2 TOTAL: 33 mmol/L — ABNORMAL HIGH (ref 21–31)
CREATININE: 0.93 mg/dL (ref 0.60–1.30)
ESTIMATED GFR: 87 mL/min/{1.73_m2} (ref >59)
GLUCOSE: 123 mg/dL — ABNORMAL HIGH (ref 74–109)
OSMOLALITY, CALCULATED: 284 mosm/kg (ref 270–290)
POTASSIUM: 5.1 mmol/L (ref 3.5–5.1)
SODIUM: 142 mmol/L (ref 136–145)

## 2023-10-06 LAB — URINALYSIS, MACROSCOPIC
BILIRUBIN: NEGATIVE mg/dL
BLOOD: NEGATIVE mg/dL
GLUCOSE: NEGATIVE mg/dL
KETONES: NEGATIVE mg/dL
LEUKOCYTES: NEGATIVE WBCs/uL
NITRITE: NEGATIVE
PH: 7.5 (ref 5.0–9.0)
PROTEIN: NEGATIVE mg/dL
SPECIFIC GRAVITY: 1.004 (ref 1.002–1.030)
UROBILINOGEN: NORMAL mg/dL

## 2023-10-06 LAB — CBC
HCT: 40.9 % (ref 36.7–47.1)
HGB: 14.5 g/dL (ref 12.5–16.3)
MCH: 31 pg (ref 23.8–33.4)
MCHC: 35.4 g/dL (ref 32.5–36.3)
MCV: 87.4 fL (ref 73.0–96.2)
MPV: 5.8 fL — ABNORMAL LOW (ref 7.4–11.4)
PLATELETS: 273 10*3/uL (ref 140–440)
RBC: 4.68 10*6/uL (ref 4.06–5.63)
RDW: 13 % (ref 12.1–16.2)
WBC: 6.3 10*3/uL (ref 3.6–10.2)

## 2023-10-06 LAB — URINALYSIS, MICROSCOPIC
RBCS: 0 /[HPF] (ref <4)
WBCS: 0 /[HPF] (ref <6)

## 2023-10-08 DIAGNOSIS — R9431 Abnormal electrocardiogram [ECG] [EKG]: Secondary | ICD-10-CM

## 2023-10-08 DIAGNOSIS — Z01810 Encounter for preprocedural cardiovascular examination: Secondary | ICD-10-CM

## 2023-10-08 LAB — ECG 12 LEAD
Atrial Rate: 72 {beats}/min
Calculated P Axis: 72 degrees
Calculated R Axis: 36 degrees
Calculated T Axis: 36 degrees
PR Interval: 168 ms
QRS Duration: 106 ms
QT Interval: 390 ms
QTC Calculation: 427 ms
Ventricular rate: 72 {beats}/min

## 2023-10-11 ENCOUNTER — Ambulatory Visit
Admission: RE | Admit: 2023-10-11 | Discharge: 2023-10-11 | Disposition: A | Discharge status: Home / Self Care | Payer: Self-pay | Source: Ambulatory Visit | Attending: Student in an Organized Health Care Education/Training Program | Admitting: Student in an Organized Health Care Education/Training Program

## 2023-10-15 ENCOUNTER — Other Ambulatory Visit
Payer: Medicare Other | Attending: Student in an Organized Health Care Education/Training Program | Admitting: Student in an Organized Health Care Education/Training Program

## 2023-10-15 ENCOUNTER — Other Ambulatory Visit: Payer: Self-pay

## 2023-10-15 ENCOUNTER — Ambulatory Visit (INDEPENDENT_AMBULATORY_CARE_PROVIDER_SITE_OTHER): Payer: Self-pay

## 2023-10-15 DIAGNOSIS — Z466 Encounter for fitting and adjustment of urinary device: Secondary | ICD-10-CM

## 2023-10-15 DIAGNOSIS — R300 Dysuria: Secondary | ICD-10-CM | POA: Insufficient documentation

## 2023-10-15 NOTE — Nursing Note (Addendum)
Catheter Removal    Consent Signed: yes  Position of patient: Supine  Inplace catheter size: 25fr  Inplace catheter type: coude  Water amount removed from catheter balloon: 10cc  Catheter removed:yes  Catheter Intact: yes  Patient tolerated procedure: yes  Complications: none  Patient asked to urinate after procedure? no    Additional details:    Pt instructed to return to clinic at 3:00 PM for bladder scan.     Berneda Rose, LPN     Pt returned to clinic at 3:00 PM for bladder scan.  States he has urinated 5 times since the catheter.  Pt urinated in clinic and specimen was obtained for culture per Dr Fredonia Highland. PVR was 135.  Catheter was not placed at this time.  Pt scheduled for prostate biopsy on 12/23.

## 2023-10-15 NOTE — Addendum Note (Signed)
Addended by: Payton Emerald on: 10/15/2023 03:02 PM     Modules accepted: Orders

## 2023-10-16 ENCOUNTER — Other Ambulatory Visit (INDEPENDENT_AMBULATORY_CARE_PROVIDER_SITE_OTHER): Payer: Self-pay | Admitting: Student in an Organized Health Care Education/Training Program

## 2023-10-17 ENCOUNTER — Encounter (HOSPITAL_COMMUNITY): Payer: Self-pay | Admitting: Student in an Organized Health Care Education/Training Program

## 2023-10-17 LAB — URINE CULTURE: URINE CULTURE: NO GROWTH

## 2023-10-19 HISTORY — PX: PROSTATE BIOPSY: SHX241

## 2023-10-20 ENCOUNTER — Ambulatory Visit (HOSPITAL_COMMUNITY): Payer: Medicare Other | Admitting: Student in an Organized Health Care Education/Training Program

## 2023-10-20 ENCOUNTER — Encounter (HOSPITAL_COMMUNITY): Payer: Self-pay | Admitting: Student in an Organized Health Care Education/Training Program

## 2023-10-20 ENCOUNTER — Inpatient Hospital Stay
Admission: RE | Admit: 2023-10-20 | Discharge: 2023-10-20 | Disposition: A | Discharge status: Home / Self Care | Payer: Medicare Other | Source: Ambulatory Visit | Attending: Student in an Organized Health Care Education/Training Program | Admitting: Student in an Organized Health Care Education/Training Program

## 2023-10-20 ENCOUNTER — Other Ambulatory Visit: Payer: Self-pay

## 2023-10-20 ENCOUNTER — Encounter (HOSPITAL_COMMUNITY)
Admission: RE | Disposition: A | Discharge status: Home / Self Care | Payer: Self-pay | Source: Ambulatory Visit | Attending: Student in an Organized Health Care Education/Training Program

## 2023-10-20 DIAGNOSIS — M539 Dorsopathy, unspecified: Secondary | ICD-10-CM | POA: Insufficient documentation

## 2023-10-20 DIAGNOSIS — Z79899 Other long term (current) drug therapy: Secondary | ICD-10-CM | POA: Insufficient documentation

## 2023-10-20 DIAGNOSIS — F319 Bipolar disorder, unspecified: Secondary | ICD-10-CM | POA: Insufficient documentation

## 2023-10-20 DIAGNOSIS — I10 Essential (primary) hypertension: Secondary | ICD-10-CM | POA: Insufficient documentation

## 2023-10-20 DIAGNOSIS — E785 Hyperlipidemia, unspecified: Secondary | ICD-10-CM | POA: Insufficient documentation

## 2023-10-20 DIAGNOSIS — Z9989 Dependence on other enabling machines and devices: Secondary | ICD-10-CM | POA: Insufficient documentation

## 2023-10-20 DIAGNOSIS — C61 Malignant neoplasm of prostate: Secondary | ICD-10-CM | POA: Insufficient documentation

## 2023-10-20 DIAGNOSIS — G4730 Sleep apnea, unspecified: Secondary | ICD-10-CM | POA: Insufficient documentation

## 2023-10-20 HISTORY — DX: Dorsopathy, unspecified: M53.9

## 2023-10-20 HISTORY — DX: Sleep apnea, unspecified: G47.30

## 2023-10-20 HISTORY — DX: Depression, unspecified: F32.A

## 2023-10-20 HISTORY — DX: Dependence on other enabling machines and devices: Z99.89

## 2023-10-20 HISTORY — DX: Unspecified osteoarthritis, unspecified site: M19.90

## 2023-10-20 HISTORY — DX: Disorder of prostate, unspecified: N42.9

## 2023-10-20 HISTORY — DX: Other general symptoms and signs: R68.89

## 2023-10-20 SURGERY — BIOPSY PROSTATE TRANSURETHRAL
Anesthesia: Monitor Anesthesia Care | Site: Prostate | Wound class: Clean Contaminated Wounds-The respiratory, GI, Genital, or urinary

## 2023-10-20 MED ORDER — LACTATED RINGERS INTRAVENOUS SOLUTION
INTRAVENOUS | Status: DC | PRN
Start: 2023-10-20 — End: 2023-10-20
  Administered 2023-10-20: 0 via INTRAVENOUS

## 2023-10-20 MED ORDER — LACTATED RINGERS INTRAVENOUS SOLUTION
INTRAVENOUS | Status: DC
Start: 2023-10-20 — End: 2023-10-20

## 2023-10-20 MED ORDER — ONDANSETRON HCL (PF) 4 MG/2 ML INJECTION SOLUTION
4.0000 mg | Freq: Once | INTRAMUSCULAR | Status: AC
Start: 2023-10-20 — End: 2023-10-20
  Administered 2023-10-20: 4 mg via INTRAVENOUS

## 2023-10-20 MED ORDER — ONDANSETRON HCL (PF) 4 MG/2 ML INJECTION SOLUTION
INTRAMUSCULAR | Status: AC
Start: 2023-10-20 — End: 2023-10-20
  Filled 2023-10-20: qty 2

## 2023-10-20 MED ORDER — SODIUM CHLORIDE 0.9 % INTRAVENOUS SOLUTION
5.0000 mg/kg | Freq: Once | INTRAVENOUS | Status: AC
Start: 2023-10-20 — End: 2023-10-20
  Administered 2023-10-20: 400 mg via INTRAVENOUS
  Filled 2023-10-20 (×2): qty 10

## 2023-10-20 MED ORDER — ONDANSETRON HCL (PF) 4 MG/2 ML INJECTION SOLUTION
4.0000 mg | Freq: Once | INTRAMUSCULAR | Status: DC | PRN
Start: 2023-10-20 — End: 2023-10-20

## 2023-10-20 MED ORDER — ONDANSETRON HCL (PF) 4 MG/2 ML INJECTION SOLUTION
4.0000 mg | Freq: Three times a day (TID) | INTRAMUSCULAR | Status: DC | PRN
Start: 2023-10-20 — End: 2023-10-20

## 2023-10-20 MED ORDER — SODIUM CHLORIDE 0.9 % (FLUSH) INJECTION SYRINGE
3.0000 mL | INJECTION | Freq: Three times a day (TID) | INTRAMUSCULAR | Status: DC
Start: 2023-10-20 — End: 2023-10-20

## 2023-10-20 MED ORDER — FAMOTIDINE (PF) 20 MG/2 ML INTRAVENOUS SOLUTION
20.0000 mg | Freq: Once | INTRAVENOUS | Status: AC
Start: 2023-10-20 — End: 2023-10-20
  Administered 2023-10-20: 20 mg via INTRAVENOUS

## 2023-10-20 MED ORDER — MORPHINE 4 MG/ML INJECTION WRAPPER
1.0000 mg | INJECTION | INTRAMUSCULAR | Status: DC | PRN
Start: 2023-10-20 — End: 2023-10-20

## 2023-10-20 MED ORDER — SODIUM CHLORIDE 0.9 % (FLUSH) INJECTION SYRINGE
3.0000 mL | INJECTION | INTRAMUSCULAR | Status: DC | PRN
Start: 2023-10-20 — End: 2023-10-20

## 2023-10-20 MED ORDER — FENTANYL (PF) 50 MCG/ML INJECTION WRAPPER
50.0000 ug | INJECTION | INTRAMUSCULAR | Status: DC | PRN
Start: 2023-10-20 — End: 2023-10-20

## 2023-10-20 MED ORDER — PROPOFOL 10 MG/ML IV BOLUS
INJECTION | Freq: Once | INTRAVENOUS | Status: DC | PRN
Start: 2023-10-20 — End: 2023-10-20
  Administered 2023-10-20: 60 mg via INTRAVENOUS
  Administered 2023-10-20: 50 mg via INTRAVENOUS

## 2023-10-20 MED ORDER — DEXAMETHASONE SODIUM PHOSPHATE 4 MG/ML INJECTION SOLUTION
4.0000 mg | Freq: Once | INTRAMUSCULAR | Status: AC
Start: 2023-10-20 — End: 2023-10-20
  Administered 2023-10-20: 4 mg via INTRAVENOUS

## 2023-10-20 MED ORDER — FENTANYL (PF) 50 MCG/ML INJECTION WRAPPER
INJECTION | Freq: Once | INTRAMUSCULAR | Status: DC | PRN
Start: 2023-10-20 — End: 2023-10-20
  Administered 2023-10-20 (×2): 25 ug via INTRAVENOUS

## 2023-10-20 MED ORDER — ALBUTEROL SULFATE 2.5 MG/3 ML (0.083 %) SOLUTION FOR NEBULIZATION
2.5000 mg | INHALATION_SOLUTION | Freq: Once | RESPIRATORY_TRACT | Status: DC | PRN
Start: 2023-10-20 — End: 2023-10-20

## 2023-10-20 MED ORDER — ACETAMINOPHEN 325 MG TABLET
650.0000 mg | ORAL_TABLET | Freq: Four times a day (QID) | ORAL | Status: DC | PRN
Start: 2023-10-20 — End: 2023-10-20

## 2023-10-20 MED ORDER — PROCHLORPERAZINE EDISYLATE 10 MG/2 ML (5 MG/ML) INJECTION SOLUTION
5.0000 mg | Freq: Once | INTRAMUSCULAR | Status: DC | PRN
Start: 2023-10-20 — End: 2023-10-20

## 2023-10-20 MED ORDER — LIDOCAINE HCL 20 MG/ML (2 %) INJECTION SOLUTION
INTRAMUSCULAR | Status: AC
Start: 2023-10-20 — End: 2023-10-20
  Filled 2023-10-20: qty 20

## 2023-10-20 MED ORDER — PROPOFOL 10 MG/ML INTRAVENOUS EMULSION
INTRAVENOUS | Status: DC | PRN
Start: 2023-10-20 — End: 2023-10-20
  Administered 2023-10-20: 75 ug/kg/min via INTRAVENOUS
  Administered 2023-10-20: 0 ug/kg/min via INTRAVENOUS

## 2023-10-20 MED ORDER — LIDOCAINE (PF) 100 MG/5 ML (2 %) INTRAVENOUS SYRINGE
INJECTION | Freq: Once | INTRAVENOUS | Status: DC | PRN
Start: 2023-10-20 — End: 2023-10-20
  Administered 2023-10-20: 80 mg via INTRAVENOUS

## 2023-10-20 MED ORDER — IPRATROPIUM 0.5 MG-ALBUTEROL 3 MG (2.5 MG BASE)/3 ML NEBULIZATION SOLN
3.0000 mL | INHALATION_SOLUTION | Freq: Once | RESPIRATORY_TRACT | Status: DC | PRN
Start: 2023-10-20 — End: 2023-10-20

## 2023-10-20 MED ORDER — FENTANYL (PF) 50 MCG/ML INJECTION WRAPPER
25.0000 ug | INJECTION | INTRAMUSCULAR | Status: DC | PRN
Start: 2023-10-20 — End: 2023-10-20

## 2023-10-20 MED ORDER — FAMOTIDINE (PF) 20 MG/2 ML INTRAVENOUS SOLUTION
INTRAVENOUS | Status: AC
Start: 2023-10-20 — End: 2023-10-20
  Filled 2023-10-20: qty 2

## 2023-10-20 MED ORDER — LIDOCAINE HCL 20 MG/ML (2 %) INJECTION SOLUTION
Freq: Once | INTRAMUSCULAR | Status: DC | PRN
Start: 2023-10-20 — End: 2023-10-20
  Administered 2023-10-20: 5 mL via INTRAMUSCULAR

## 2023-10-20 MED ORDER — FENTANYL (PF) 50 MCG/ML INJECTION SOLUTION
INTRAMUSCULAR | Status: AC
Start: 2023-10-20 — End: 2023-10-20
  Filled 2023-10-20: qty 2

## 2023-10-20 MED ORDER — GENTAMICIN IV - PHARMACIST TO DOSE PER PROTOCOL
Freq: Every day | Status: DC | PRN
Start: 2023-10-20 — End: 2023-10-20

## 2023-10-20 MED ORDER — DEXAMETHASONE SODIUM PHOSPHATE 4 MG/ML INJECTION SOLUTION
INTRAMUSCULAR | Status: AC
Start: 2023-10-20 — End: 2023-10-20
  Filled 2023-10-20: qty 1

## 2023-10-20 SURGICAL SUPPLY — 13 items
CONTAINR HISTO C90ML 10% NEUT BF FRMLN POLYPROP PREFL 60ML (SPECIMEN COLLECTION SUPPLIES) ×12 IMPLANT
DETERGENT INSTR 22OZ TRNSPT GEL RINSE FREE NEUT PH PREKLENZ CLR PLSNT LF (MISCELLANEOUS PT CARE ITEMS) ×1 IMPLANT
GLOVE SURG 7.5 LF  PF BEAD CUF STRL CRM 11.8IN PROTEXIS PI PLISPRN THK9.1 MIL (GLOVES AND ACCESSORIES) ×1 IMPLANT
GOWN SURG 2XL STD LGTH REG L3 NONREINFORCE BRTHBL TWL STRL LF  DISP BLU HALYARD SPECTRUM SMS (DRAPE/PACKS/SHEETS/OR TOWEL) ×1 IMPLANT
GOWN SURG XL STD LGTH L3 HKLP CLSR RGLN SLEEVE TWL STRL LF  DISP GRN AERO BLU PRFRM FBRC (DRAPE/PACKS/SHEETS/OR TOWEL) ×1 IMPLANT
INSTR BIOPSY PNK 25CM 18GA MXCOR 22MM ANG 2 TRGR UL SHRP TIP BVL 13.8CM STRL LF  DISP (MED SURG SUPPLIES) ×1 IMPLANT
LABEL MED CORRECT MED LABELING SYS 4 FLG 2 SHEET 24 PRPRNT STRL (MED SURG SUPPLIES) ×1 IMPLANT
NEEDLE HYPO  18GA 1.5IN REG WL BD PRCSNGL POLYPROP REG BVL LL HUB CLR CD DEHP-FR STRL LF  DISP (MED SURG SUPPLIES) ×1 IMPLANT
NEEDLE SPINAL BLK 7IN 22GA QUINCKE LONG LGTH REG WL POLYPROP STRL LF  DISP (MED SURG SUPPLIES) ×1 IMPLANT
PAD DRESS 8X3IN MDCHC NONADH NWVN LF  STRL DISP WHT (WOUND CARE SUPPLY) ×1 IMPLANT
SOL IRRG 0.9% NACL 1000ML PLASTIC PR BTL ISTNC N-PYRG STRL LF (MEDICATIONS/SOLUTIONS) ×1 IMPLANT
SYRINGE LL 10ML LF  STRL GRAD N-PYRG DEHP-FR PVC FREE MED DISP (MED SURG SUPPLIES) ×1 IMPLANT
TOWEL 24X16IN COTTON BLU DISP SURG STRL LF (DRAPE/PACKS/SHEETS/OR TOWEL) ×1 IMPLANT

## 2023-10-20 NOTE — Anesthesia Transfer of Care (Signed)
ANESTHESIA TRANSFER OF CARE   Michael Norton is a 72 y.o. ,male, Weight: 86.2 kg (190 lb)   had Procedure(s):  MAGNETIC RESONANCE IMAGING FUSION PROSTATE BIOPSY  performed  10/20/23   Primary Service: Arlana Hove, DO    Past Medical History:   Diagnosis Date    Arthritis     Back problem     Bipolar disorder (CMS HCC) 01/15/2022    BPH (benign prostatic hyperplasia) 01/15/2022    CPAP (continuous positive airway pressure) dependence     Depression     Disorder of prostate     Elevated PSA 01/15/2022    06/2020 mpMRI PI-RADS 2    Elevated PSA     Gastric ulcer 01/15/2022    HLD (hyperlipidemia) 01/15/2022    HTN (hypertension) 01/15/2022    Neck problem     Schizophrenia (CMS HCC) 01/15/2022    Pt denies any hx of dx    Sleep apnea     Urinary frequency 01/15/2022      Allergy History as of 10/20/23       CODEINE         Noted Status Severity Type Reaction    10/17/23 1019 Delight Hoh, RN 11/07/17 Active Low  Mental Status Effect, Nausea/ Vomiting,  Other Adverse Reaction (Add comment)    Comments: GI intolerance -- Hallucinations    Can take Hydrocodone     10/17/23 1019 Delight Hoh, RN 11/07/17 Active Low  Mental Status Effect, Nausea/ Vomiting,  Other Adverse Reaction (Add comment)    Comments: GI intolerance    Can take Hydrocodone     10/17/23 1019 Delight Hoh, RN 11/07/17 Active Low  Nausea/ Vomiting, Mental Status Effect,  Other Adverse Reaction (Add comment)    Comments: Other Reaction(s): GI intolerance    Can take Hydrocodone     01/15/22 0630 Rover, [place], [place] 11/07/17 Active Low  Nausea/ Vomiting              METHOCARBAMOL         Noted Status Severity Type Reaction    10/17/23 1019 Delight Hoh, RN 05/22/23 Active Low  Hives/ Urticaria                  I completed my transfer of care / handoff to the receiving personnel during which we discussed:  Access, Airway, All key/critical aspects of case discussed, Analgesia, Antibiotics, Expectation of post procedure, Fluids/Product,  Gave opportunity for questions and acknowledgement of understanding, Labs and PMHx                                                                   Last OR Temp: Temperature: 36.2 C (97.1 F)  ABG:  POTASSIUM   Date Value Ref Range Status   10/06/2023 5.1 3.5 - 5.1 mmol/L Final     KETONES   Date Value Ref Range Status   10/06/2023 Negative Negative, Trace mg/dL Final     CALCIUM   Date Value Ref Range Status   10/06/2023 9.8 8.6 - 10.3 mg/dL Final     Calculated P Axis   Date Value Ref Range Status   10/06/2023 72 degrees Final     Calculated R Axis   Date Value Ref Range Status  10/06/2023 36 degrees Final     Calculated T Axis   Date Value Ref Range Status   10/06/2023 36 degrees Final     Airway:* No LDAs found *  Blood pressure 97/69, pulse 72, temperature 36.2 C (97.1 F), resp. rate 19, height 1.778 m (5\' 10" ), weight 86.2 kg (190 lb), SpO2 99%.

## 2023-10-20 NOTE — Anesthesia Preprocedure Evaluation (Signed)
ANESTHESIA PRE-OP EVALUATION  Planned Procedure: MAGNETIC RESONANCE IMAGING FUSION PROSTATE BIOPSY (Prostate)  Review of Systems     anesthesia history negative     patient summary reviewed  nursing notes reviewed        Pulmonary   sleep apnea and CPAP,   Cardiovascular    Hypertension, ECG reviewed, ACE / ARB inhibitor use, ACE inhibitor taken in the last 24 hours and hyperlipidemia , Exercise Tolerance: > or = 4 METS        GI/Hepatic/Renal   negative GI/hepatic/renal ROS,         Endo/Other   neg endo/other ROS,       Neuro/Psych/MS    psychiatric history, back abnormality, depression, bipolar disorder     Cancer    negative hematology/oncology ROS,                     Physical Assessment      Airway       Mallampati: III    TM distance: >3 FB    Mouth Opening: poor.            Dental           (+) caps           Pulmonary           Cardiovascular             Other findings              Plan  ASA 2     Planned anesthesia type: MAC           PONV Plan:  I plan to administer pharmcologic prophalaxis antiemetics  POV PLAN:   plan for postoperative opioid use            Intravenous induction     Anesthesia issues/risks discussed are: Post-op Pain Management, Stroke, Aspiration, Cardiac Events/MI, Blood Loss, PONV and Sore Throat.  Anesthetic plan and risks discussed with patient  signed consent obtained          Patient's NPO status is appropriate for Anesthesia.           Plan discussed with CRNA.

## 2023-10-20 NOTE — H&P (Signed)
[facility]  Urology Admission History and Physical      Michael Norton, Michael Norton, 72 y.o. male  Encounter Start Date:  10/20/2023  Inpatient Admission Date:    Date of Birth:  1951/03/19    PCP: Jomarie Longs, NP    Information Obtained from: patient  Chief Complaint: ePSA       HPI:    Mr. Larke is a pleasant 72 year old man with a past medical history of BPH on Flomax and elevated PSA status post negative biopsy on May 01, 2022.  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  He did well after his biopsy and denies any complaints.  This was his 1st lifetime biopsy.  He has a family history of prostate cancer in brothers. He is had 2 multiparametric MRI is 11/16/2018 in 2021 both which demonstrated PI-RADS 2 lesions.  His prostate volume is 28 cc.      He recently had a PSA which was elevated at 10.6.  This was done as preoperative test preparation for upcoming spinal surgery. He recently completed cervical surgery with cages placed. His most recent PSA is 11.45. He denies urinary complaints.      He underwent a prostate MRI in September 17, 2023 which demonstrated a 31 cc prostate with a PI-RADS 2 lesion as well as a PI-RADS 3 lesion. He denies fevers, chills, nausea, vomiting, hematuria, dysuria, flank pain, incontinence, dribbling, hesitancy, suprapubic pain, headaches, vision changes, shortness of breath, chest pain.          PSA  01/02/21: 5.57   PSA 05/25/21: 5.34   PSA 12/26/21: 5.67   PSA 03/18/22: 6.55   PSA 11/14/2022 10.6    PROSTATE SPECIFIC ANTIGEN   Lab Results   Component Value Date/Time    PROSSPECAG 11.45 (H) 05/06/2023 09:11 AM    PROSSPECAG 6.55 (H) 03/18/2022 08:34 AM    PROSSPECAG 5.67 (H) 01/15/2022 09:09 AM                ROS:  MUST comment on all "Abnormal" findings   ROS Other than ROS in the HPI, all other systems were negative.      PAST MEDICAL/ FAMILY/ SOCIAL HISTORY:       Past Medical History:    Diagnosis Date    Arthritis     Back problem     Bipolar disorder (CMS HCC) 01/15/2022    BPH (benign prostatic hyperplasia) 01/15/2022    CPAP (continuous positive airway pressure) dependence     Depression     Disorder of prostate     Elevated PSA 01/15/2022    06/2020 mpMRI PI-RADS 2    Elevated PSA     Gastric ulcer 01/15/2022    HLD (hyperlipidemia) 01/15/2022    HTN (hypertension) 01/15/2022    Neck problem     Schizophrenia (CMS HCC) 01/15/2022    Pt denies any hx of dx    Sleep apnea     Urinary frequency 01/15/2022         Allergies   Allergen Reactions    Codeine Mental Status Effect, Nausea/ Vomiting and  Other Adverse Reaction (Add comment)     GI intolerance -- Hallucinations    Can take Hydrocodone    Methocarbamol Hives/ Urticaria     Medications Prior to Admission       Prescriptions    ascorbic acid, vitamin C, (VITAMIN C) 500 mg Oral Tablet  Take 1 Tablet (500 mg total) by mouth Once a day    cephalexin (KEFLEX) 500 mg Oral Capsule    Take 1 Capsule (500 mg total) by mouth Three times a day    Patient not taking:  Reported on 10/20/2023    diclofenac sodium (VOLTAREN) 1 % Gel    Apply 2 g topically    diphenhydrAMINE (SLEEP AID, DIPHENHYDRAMINE,) 25 mg Oral Capsule    Take 1 Capsule (25 mg total) by mouth Every evening    doxycycline 100 mg Oral Tablet    Take 1 Tablet (100 mg total) by mouth Twice daily    doxycycline hyclate (VIBRAMYCIN) 100 mg Oral Capsule    Take 1 Capsule (100 mg total) by mouth Twice daily for 3 days    etodolac (LODINE) 400 mg Oral Tablet    Take 1 Tablet (400 mg total) by mouth Twice daily    famotidine (PEPCID) 40 mg Oral Tablet    TAKE 1 TABLET BY MOUTH DAILY IN THE MORNING    gabapentin (NEURONTIN) 400 mg Oral Capsule    Take 1 Capsule (400 mg total) by mouth 5 times a day    levomilnacipran (FETZIMA) 20 mg Oral Capsule,Sustained Action 24 hr    Take 1 Capsule (20 mg total) by mouth Once a day    lisinopriL (PRINIVIL) 20 mg Oral Tablet    Take 1 Tablet (20 mg total)  by mouth Once a day    metoprolol tartrate (LOPRESSOR) 25 mg Oral Tablet    Take 1 Tablet (25 mg total) by mouth Twice daily    Patient not taking:  Reported on 10/20/2023    multivit-minerals/folic acid (ADULT ONE DAILY MULTIVITAMIN ORAL)    Take 1 Tablet by mouth Once a day    multivitamin (SUPER THERA VITE M) Oral Tablet    Take 1 Tablet by mouth    omeprazole (PRILOSEC) 40 mg Oral Capsule, Delayed Release(E.C.)    Take 1 Capsule (40 mg total) by mouth Once a day    ondansetron (ZOFRAN ODT) 4 mg Oral Tablet, Rapid Dissolve    Take 1 Tablet (4 mg total) by mouth Every 8 hours as needed    promethazine-dextromethorphan (PHENERGAN-DM) 6.25-15 mg/5 mL Oral Syrup    Take 5 mL by mouth Every 6 hours as needed    rosuvastatin (CRESTOR) 5 mg Oral Tablet    Take 1 Tablet (5 mg total) by mouth    tamsulosin (FLOMAX) 0.4 mg Oral Capsule    TAKE 1 CAPSULE BY MOUTH EVERYDAY AT BEDTIME    vitamin E 400 unit Oral Capsule    Take 1 Capsule (400 Units total) by mouth Every 7 days    ziprasidone (GEODON) 20 mg Oral Capsule    Take 1 Capsule (20 mg total) by mouth Every morning    ziprasidone (GEODON) 60 mg Oral Capsule    Take 1 Capsule (60 mg total) by mouth Every evening    zolpidem (AMBIEN) 10 mg Oral Tablet    Take 1 Tablet (10 mg total) by mouth Every night as needed for Insomnia           Gentamicin IV - Pharmacist to Dose per Protocol, , Does not apply, Daily PRN  LR premix infusion, , Intravenous, Continuous  NS flush syringe, 3 mL, Intracatheter, Q8HRS  NS flush syringe, 3 mL, Intracatheter, Q1H PRN      Past Surgical History:   Procedure Laterality Date    HX BACK SURGERY  HX HERNIA REPAIR      NECK SURGERY  05/09/2023    PROSTATE BIOPSY  05/01/2022         Social History     Tobacco Use    Smoking status: Never    Smokeless tobacco: Former     Types: Catering manager status: Never Used   Substance Use Topics    Alcohol use: Never    Drug use: Never       Gen: NAD, alert  Pulm: unlabored at rest  CV:  palpable pulses  Abd: soft, Nt/ND  GU: no suprapubic tenderness, no CVAT      Assessment & Plan:   There are no active hospital problems to display for this patient.      Elevated PSA with family history of prostate cancer in 2 brothers status post negative prostate needle biopsy on May 01, 2022 with two negative MRIs in 2020 & 2021; MRI in Novemeber 2024 with PI-RADS 2&3 lesion; 31cc  I discussed the pathophysiology and potential causes of PSA elevation, including, but not limited to malignant (prostate adenocarcinoma), benign (prostate enlargement), infectious/inflammatory (prostatitis, urinary tract infection) and iatrogenic/idiopathic (recent ejaculation, instrumentation & age-related change) etiologies. Considering patient's serum PSA within the context of his current age, overall health and desire to identify early a potentially curable malignancy, I would recommend MRI-US fusion prostate biopsy  Patient was counseled on the risks of TRUS-PNBx including febrile urinary tract infection with bacteremia requiring hospitalization, prolonged bleeding (hematuria, hematochezia, hematospermia), acute urinary retention, procedural discomfort/pain, anxiety related to the diagnosis as well as limitations of the currently available biopsy schema including undersampling (false negative, incorrect risk stratification), oversampling (detection of clinically-insignificant cancer), and sampling error (necessity for repeat biopsy).  Patient was instructed on the need for the following:  Interruption of all blood thinning agents (Aspirin, NSAIDs, anticoagulants, antiplatelets) for 7-10 days prior  Prophylactic Doxycycline 100 mg BID the day before, the day of, and the day after biopsy                 BPH/LUTS on Flomax  Discussed the differential diagnosis, pathophysiology and nature of benign prostate enlargement causing lower urinary tract symptoms  Counseled patient on conservative management options including appropriate  fluid management, avoidance of diuretics including caffeine and alcohol  Continue Tamsulosin Hydrochloride (FlomaxT) 0.4 mg P.O. daily:    I have discussed in great detail with the patient the treatment of prostate enlargement/lower urinary tract symptoms using tamsulosin.  I have explained my rationale for using tamsulosin as well as potential risks of asthenia (2%), dizziness (5%), rhinitis (3%) and abnormal ejaculation (11%). I encouraged patient to report any prior or current history of alpha-1 antagonist use to their opthalmic surgeon before undergoing any eye surgery due to the risk of intraoperative floppy iris syndrome.  The patient expressed an understanding of the treatment, possible reactions, and possible prognosis.      Arlana Hove, DO

## 2023-10-20 NOTE — Nurses Notes (Signed)
Patient has no drainage from Prostate Biopsy with no dressing.

## 2023-10-20 NOTE — OR Surgeon (Signed)
[facility]                                              OPERATIVE NOTE    Patient Name: Michael Norton, Michael Norton Number: [id number]  Date of Service: 10/20/2023   Date of Birth: [birth year]    All elements must be documented.    Pre-Operative Diagnosis:prostate cancer   Post-Operative Diagnosis:same  Procedure(s)/Description:MRI-US fusion prostate needle biopsy  Findings:  31 cc prostate, PI-RADS 3 lesion in the transition zone of the anterior mid gland    Patient prepped and draped in sterile fashion.  In the left lateral decubitus position a well lubricated and fire ultrasound probe was inserted into the rectum.  Using the UroNav MRI ultrasound fusion software I created a sweeping pattern from the prostatic base of the prostatic apex in his fusion software fused the images with his MRI.  From here I identified the PI-RADS 3 lesion at the transition zone in the anterior mid gland in 2 passes were taken through this with the biopsy needle.  Following this a systematic 12 core biopsy was taken out with 6 cores from each side.  Following this 1% plain lidocaine for a volume of 1 cc was injected bilaterally at the tips of the seminal vesicles.  Patient tolerated the procedure well without untoward event      Attending Surgeon: Arlana Hove  Assistant(s): NA    Anesthesia Type: Monitor Anesthesia Care  Estimated Blood Loss:  Minimal  Blood Given: NA  Fluids Given: NS  Complications (unintended/unexpected/iatrogenic/accidental/inadvertent events):  NA  Characteristic Event (routinely expected or inherent to the difficulty/nature of the procedure): NA  Did the use of current and/or prior Anticoagulants impact the outcome of the case? No  Wound Class: Clean Contaminated Wounds -Respiratory, GI, Genital, or Urinary    Tubes: None  Drains: None  Specimens/ Cultures: 12 systematic prostate biopsies, 2 biopsies taken through region of interest  Implants: NA           Disposition: PACU -  hemodynamically stable.  Condition: stable    Plan:  Follow up in 1-2 weeks       Arlana Hove, DO

## 2023-10-20 NOTE — Anesthesia Postprocedure Evaluation (Signed)
Anesthesia Post Op Evaluation    Patient: Michael Norton  Procedure(s):  MAGNETIC RESONANCE IMAGING FUSION PROSTATE BIOPSY    Last Vitals:Temperature: 36.7 C (98 F) (10/20/23 1128)  Heart Rate: 75 (10/20/23 1128)  BP (Non-Invasive): 99/80 (10/20/23 1128)  Respiratory Rate: 19 (10/20/23 1128)  SpO2: 100 % (10/20/23 1128)    No notable events documented.    Patient is sufficiently recovered from the effects of anesthesia to participate in the evaluation and has returned to their pre-procedure level.  Patient location during evaluation: PACU       Patient participation: complete - patient participated  Level of consciousness: awake and alert and responsive to verbal stimuli    Pain management: adequate  Airway patency: patent    Anesthetic complications: no  Cardiovascular status: acceptable  Respiratory status: acceptable  Hydration status: acceptable  Patient post-procedure temperature: Pt Normothermic   PONV Status: Absent

## 2023-10-20 NOTE — Discharge Instructions (Signed)
Drink plenty of fluids.    Keep yourself clean and dry.    Call Dr for any excessive bleeding or severe pain.

## 2023-10-23 DIAGNOSIS — C61 Malignant neoplasm of prostate: Secondary | ICD-10-CM

## 2023-10-23 LAB — PROSTATE BIOPSY SPECIMENS

## 2023-10-30 ENCOUNTER — Other Ambulatory Visit (INDEPENDENT_AMBULATORY_CARE_PROVIDER_SITE_OTHER): Payer: Self-pay | Admitting: Student in an Organized Health Care Education/Training Program

## 2023-10-30 ENCOUNTER — Ambulatory Visit (INDEPENDENT_AMBULATORY_CARE_PROVIDER_SITE_OTHER): Payer: Medicare Other | Admitting: Student in an Organized Health Care Education/Training Program

## 2023-10-30 ENCOUNTER — Telehealth (INDEPENDENT_AMBULATORY_CARE_PROVIDER_SITE_OTHER): Payer: Self-pay | Admitting: Student in an Organized Health Care Education/Training Program

## 2023-10-30 ENCOUNTER — Encounter (INDEPENDENT_AMBULATORY_CARE_PROVIDER_SITE_OTHER): Payer: Self-pay | Admitting: Student in an Organized Health Care Education/Training Program

## 2023-10-30 ENCOUNTER — Other Ambulatory Visit: Payer: Self-pay

## 2023-10-30 VITALS — BP 120/65 | HR 94 | Ht 70.0 in | Wt 189.0 lb

## 2023-10-30 DIAGNOSIS — R972 Elevated prostate specific antigen [PSA]: Secondary | ICD-10-CM

## 2023-10-30 DIAGNOSIS — Z8042 Family history of malignant neoplasm of prostate: Secondary | ICD-10-CM

## 2023-10-30 DIAGNOSIS — N401 Enlarged prostate with lower urinary tract symptoms: Secondary | ICD-10-CM

## 2023-10-30 DIAGNOSIS — C61 Malignant neoplasm of prostate: Secondary | ICD-10-CM

## 2023-10-30 DIAGNOSIS — N400 Enlarged prostate without lower urinary tract symptoms: Secondary | ICD-10-CM

## 2023-10-30 NOTE — Addendum Note (Signed)
Addended by: Payton Emerald on: 10/30/2023 03:54 PM     Modules accepted: Orders

## 2023-10-30 NOTE — Telephone Encounter (Signed)
Patient called back with his decision to move forward with surgery.

## 2023-10-30 NOTE — Progress Notes (Signed)
UROLOGY, [facility]  [address] [place] [address]    Progress Note    Name: Michael Norton MRN:  [id number]   Date: 10/30/2023 DOB:  1951-03-21 (73 y.o.)             Chief Complaint: Post Operative Status (1 week post op prostate bx 10/19/2023 )  Subjective   Subjective:   Michael Norton is a pleasant 73 year old man with a past medical history of BPH on Flomax and elevated PSA status post negative biopsy on May 01, 2022.  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  He did well after his biopsy and denies any complaints.  This was his 1st lifetime biopsy.  He has a family history of prostate cancer in brothers. He is had 2 multiparametric MRI is 11/16/2018 in 2021 both which demonstrated PI-RADS 2 lesions.  His prostate volume is 28 cc.      He recently had a PSA which was elevated at 10.6.  This was done as preoperative test preparation for upcoming spinal surgery. He recently completed cervical surgery with cages placed. His most recent PSA is 11.45. He denies urinary complaints.      He underwent a prostate MRI in September 17, 2023 which demonstrated a 31 cc prostate with a PI-RADS 2 lesion as well as a PI-RADS 3 lesion.     He underwent a MRI-Us fusion prostate biopsy on 10/20/2023 which demonstrated 4 cores of Gleason 3 + 3 disease, 2 cores of Gleason 3 + 4 disease, 1 core of Gleason 4 + 3 disease, in the targeted lesion demonstrated a Gleason score of 3+3.  He denies fevers, chills, nausea, vomiting, hematuria, dysuria, flank pain, incontinence, dribbling, hesitancy, suprapubic pain, headaches, vision changes, shortness of breath, chest pain.          PSA  01/02/21: 5.57   PSA 05/25/21: 5.34   PSA 12/26/21: 5.67   PSA 03/18/22: 6.55   PSA 11/14/2022 10.6    PROSTATE SPECIFIC ANTIGEN   Lab Results   Component Value Date/Time    PROSSPECAG 11.45 (H) 05/06/2023 09:11 AM    PROSSPECAG 6.55 (H) 03/18/2022 08:34 AM     PROSSPECAG 5.67 (H) 01/15/2022 09:09 AM                Objective   Objective :  BP 120/65 (Site: Right Arm, Patient Position: Sitting, Cuff Size: Adult)   Pulse 94   Ht 1.778 m (5\' 10" )   Wt 85.7 kg (189 lb)   SpO2 98%   BMI 27.12 kg/m       Gen: NAD, alert  Pulm: unlabored at rest  CV: palpable pulses  Abd: soft, Nt/ND  GU: no suprapubic tenderness, no CVAT      Data reviewed:    Current Outpatient Medications   Medication Sig    ascorbic acid, vitamin C, (VITAMIN C) 500 mg Oral Tablet Take 1 Tablet (500 mg total) by mouth Once a day    cephalexin (KEFLEX) 500 mg Oral Capsule Take 1 Capsule (500 mg total) by mouth Three times a day    diclofenac sodium (VOLTAREN) 1 % Gel Apply 2 g topically    diphenhydrAMINE (SLEEP AID, DIPHENHYDRAMINE,) 25 mg Oral Capsule Take 1 Capsule (25 mg total) by mouth Every evening    doxycycline 100 mg Oral Tablet Take 1 Tablet (100 mg total) by mouth Twice daily  etodolac (LODINE) 400 mg Oral Tablet Take 1 Tablet (400 mg total) by mouth Twice daily    famotidine (PEPCID) 40 mg Oral Tablet TAKE 1 TABLET BY MOUTH DAILY IN THE MORNING    gabapentin (NEURONTIN) 400 mg Oral Capsule Take 1 Capsule (400 mg total) by mouth 5 times a day    levomilnacipran (FETZIMA) 20 mg Oral Capsule,Sustained Action 24 hr Take 1 Capsule (20 mg total) by mouth Once a day    lisinopriL (PRINIVIL) 20 mg Oral Tablet Take 1 Tablet (20 mg total) by mouth Once a day    metoprolol tartrate (LOPRESSOR) 25 mg Oral Tablet Take 1 Tablet (25 mg total) by mouth Twice daily    multivit-minerals/folic acid (ADULT ONE DAILY MULTIVITAMIN ORAL) Take 1 Tablet by mouth Once a day    multivitamin (SUPER THERA VITE M) Oral Tablet Take 1 Tablet by mouth    nabumetone (RELAFEN) 750 mg Oral Tablet Take 1 Tablet (750 mg total) by mouth Twice daily    omeprazole (PRILOSEC) 40 mg Oral Capsule, Delayed Release(E.C.) Take 1 Capsule (40 mg total) by mouth Once a day    ondansetron (ZOFRAN ODT) 4 mg Oral Tablet, Rapid Dissolve Take  1 Tablet (4 mg total) by mouth Every 8 hours as needed    promethazine-dextromethorphan (PHENERGAN-DM) 6.25-15 mg/5 mL Oral Syrup Take 5 mL by mouth Every 6 hours as needed    rosuvastatin (CRESTOR) 5 mg Oral Tablet Take 1 Tablet (5 mg total) by mouth    tamsulosin (FLOMAX) 0.4 mg Oral Capsule TAKE 1 CAPSULE BY MOUTH EVERYDAY AT BEDTIME    vitamin E 400 unit Oral Capsule Take 1 Capsule (400 Units total) by mouth Every 7 days    ziprasidone (GEODON) 20 mg Oral Capsule Take 1 Capsule (20 mg total) by mouth Every morning    ziprasidone (GEODON) 60 mg Oral Capsule Take 1 Capsule (60 mg total) by mouth Every evening    zolpidem (AMBIEN) 10 mg Oral Tablet Take 1 Tablet (10 mg total) by mouth Every night as needed for Insomnia        Assessment/Plan  Problem List Items Addressed This Visit          Urology    BPH (benign prostatic hyperplasia)    Relevant Orders    [facility] BONE SCAN WHOLE BODY WITH SPECT    Elevated PSA - Primary    Relevant Orders    [facility] BONE SCAN WHOLE BODY WITH SPECT     Other Visit Diagnoses       Family history of prostate cancer        Relevant Orders    [facility] BONE SCAN WHOLE BODY WITH SPECT    Prostate cancer (CMS HCC)        Relevant Orders    [facility] BONE SCAN WHOLE BODY WITH SPECT          Elevated PSA with family history of prostate cancer in 2 brothers status post negative prostate needle biopsy on May 01, 2022 with two negative MRIs in 2020 & 2021; MRI in Novemeber 2024 with PI-RADS 2&3 lesion; 31cc; 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  I discussed the risks benefits and indications of each as well as the equivalent treatment outcomes.  We discussed about incontinence rates as well as erectile dysfunction between the 2  procedures and how they equilibrating to baseline after 1  year of treatment.   Nuclear medicine bone scan has been ordered to complete his staging.  The patient will contact us with his decision on surgery which we would send a referral to Dr. Thad Ranger in [place] versus external beam radiation with a 1 time six-month formulary of Eligard 45 mg  Leuprolide acetate (LupronT, EligardT) injection:    I have discussed in great detail with the patient the treatment of advanced prostate cancer using periodic leuprolide injections.  I have explained my rationale for using leuprolide as well as potential risks of malaise, fatigue, hot flashes/sweats, accelerated bone loss and testicular atrophy.  The patient expressed an understanding of the treatment, possible reactions, and possible prognosis.                BPH/LUTS on Flomax  Discussed the differential diagnosis, pathophysiology and nature of benign prostate enlargement causing lower urinary tract symptoms  Counseled patient on conservative management options including appropriate fluid management, avoidance of diuretics including caffeine and alcohol  Continue Tamsulosin Hydrochloride (FlomaxT) 0.4 mg P.O. daily:    I have discussed in great detail with the patient the treatment of prostate enlargement/lower urinary tract symptoms using tamsulosin.  I have explained my rationale for using tamsulosin as well as potential risks of asthenia (2%), dizziness (5%), rhinitis (3%) and abnormal ejaculation (11%). I encouraged patient to report any prior or current history of alpha-1 antagonist use to their opthalmic surgeon before undergoing any eye surgery due to the risk of intraoperative floppy iris syndrome.  The patient expressed an understanding of the treatment, possible reactions, and possible prognosis.        Arlana Hove, DO     A combined total of 30 minutes were spent preparing to see the patient, reviewing previous records, ordering tests/medications/procedures, documenting the clinical encounter as well as  performing a medically appropriate evaluation and independently interpreting results and communicating them to the patient/family/caregiver as specifically outlined above in the impression and plan.    This note may have been fully or partially generated using MModal Fluency Direct system, and there may be some incorrect words, spellings, and punctuation that were not identified in checking the note before saving.

## 2023-11-20 ENCOUNTER — Ambulatory Visit
Admission: RE | Admit: 2023-11-20 | Discharge: 2023-11-20 | Disposition: A | Discharge status: Home / Self Care | Payer: Medicare Other | Source: Ambulatory Visit | Attending: Student in an Organized Health Care Education/Training Program | Admitting: Student in an Organized Health Care Education/Training Program

## 2023-11-20 ENCOUNTER — Ambulatory Visit (HOSPITAL_COMMUNITY)
Admission: RE | Admit: 2023-11-20 | Discharge: 2023-11-20 | Disposition: A | Discharge status: Home / Self Care | Payer: Medicare Other | Source: Ambulatory Visit

## 2023-11-20 ENCOUNTER — Other Ambulatory Visit: Payer: Self-pay

## 2023-11-20 DIAGNOSIS — R972 Elevated prostate specific antigen [PSA]: Secondary | ICD-10-CM | POA: Insufficient documentation

## 2023-11-20 DIAGNOSIS — C61 Malignant neoplasm of prostate: Secondary | ICD-10-CM | POA: Insufficient documentation

## 2023-11-20 DIAGNOSIS — N400 Enlarged prostate without lower urinary tract symptoms: Secondary | ICD-10-CM | POA: Insufficient documentation

## 2023-11-20 DIAGNOSIS — Z8042 Family history of malignant neoplasm of prostate: Secondary | ICD-10-CM | POA: Insufficient documentation

## 2024-02-09 ENCOUNTER — Encounter (INDEPENDENT_AMBULATORY_CARE_PROVIDER_SITE_OTHER): Payer: Self-pay

## 2024-04-14 ENCOUNTER — Other Ambulatory Visit (INDEPENDENT_AMBULATORY_CARE_PROVIDER_SITE_OTHER): Payer: Self-pay | Admitting: Student in an Organized Health Care Education/Training Program
